# Patient Record
Sex: Female | Born: 1990 | Race: White | Hispanic: No | Marital: Single | Smoking: Current every day smoker
Health system: Southern US, Community
[De-identification: ages and names within clinical notes are randomized; demographics above are authoritative.]

## PROBLEM LIST (undated history)

## (undated) ENCOUNTER — Inpatient Hospital Stay (HOSPITAL_COMMUNITY): Payer: Self-pay

## (undated) DIAGNOSIS — R51 Headache: Secondary | ICD-10-CM

## (undated) DIAGNOSIS — N809 Endometriosis, unspecified: Secondary | ICD-10-CM

## (undated) DIAGNOSIS — D649 Anemia, unspecified: Secondary | ICD-10-CM

## (undated) DIAGNOSIS — O2340 Unspecified infection of urinary tract in pregnancy, unspecified trimester: Secondary | ICD-10-CM

## (undated) DIAGNOSIS — R519 Headache, unspecified: Secondary | ICD-10-CM

## (undated) DIAGNOSIS — F419 Anxiety disorder, unspecified: Secondary | ICD-10-CM

## (undated) DIAGNOSIS — A749 Chlamydial infection, unspecified: Secondary | ICD-10-CM

## (undated) DIAGNOSIS — E049 Nontoxic goiter, unspecified: Secondary | ICD-10-CM

## (undated) HISTORY — PX: OTHER SURGICAL HISTORY: SHX169

## (undated) HISTORY — DX: Endometriosis, unspecified: N80.9

## (undated) HISTORY — PX: TYMPANOSTOMY TUBE PLACEMENT: SHX32

---
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
## 2010-04-13 ENCOUNTER — Emergency Department (HOSPITAL_COMMUNITY): Admission: EM | Admit: 2010-04-13 | Discharge: 2010-04-13 | Payer: Self-pay | Admitting: Emergency Medicine

## 2010-07-26 ENCOUNTER — Encounter: Payer: Self-pay | Admitting: Family Medicine

## 2010-08-27 ENCOUNTER — Encounter: Payer: Self-pay | Admitting: Ophthalmology

## 2010-09-06 NOTE — Progress Notes (Signed)
Summary: Excuse note for school  Phone Note Call from Patient Call back at Home Phone [phone]   Caller: Mom Reason for Call: Talk to Nurse Summary of Call: pts mom sts pt is still not feeling well & didn't go to school today, she would like a doctor's note faxed to [employer], fax # [phone] Initial call taken by: ERIN LEVAN,  Jan 02, 2007 1:40 PM  Follow-up for Phone Call        Is this ok? Follow-up by: Jone Baseman CMA,  Jan 02, 2007 3:08 PM  Additional Follow-up for Phone Call Additional follow up Details #1::        pts mom is checking status, pt was seen by [facility] Additional Follow-up by: ERIN LEVAN,  January 05, 2007 8:54 AM   Additional Follow-up for Phone Call Additional follow up Details #2::    checking status Follow-up by: Haydee Salter,  January 07, 2007 10:32 AM  Will forward into to pt's PCP - Dr. Iven Finn per Dr. Janeece Fitting.   Spoke with Dr. Iven Finn and she sts its ok to write a note for friday but only friday.  Mom agreeable.  Will fax note to above number. ...................................................................Nayomi Tabron CMA  January 07, 2007 11:54 AM

## 2010-09-06 NOTE — Progress Notes (Signed)
Summary: triage  Phone Note Call from Patient Call back at [phone]   Caller: mom-Ann Summary of Call: Pt has sore throat and cough alot wondering if she can be seen. Initial call taken by: Clydell Hakim,  September 19, 2009 10:20 AM  Follow-up for Phone Call        left message Follow-up by: Golden Circle RN,  September 19, 2009 10:26 AM  Additional Follow-up for Phone Call Additional follow up Details #1::        left message to call back & make appt for tomorrow Additional Follow-up by: Golden Circle RN,  September 19, 2009 4:18 PM

## 2010-09-06 NOTE — Miscellaneous (Signed)
  Clinical Lists Changes  Problems: Removed problem of EAR PAIN (ICD-388.70) Removed problem of DIARRHEA, INFECTIOUS (ICD-009.2) Removed problem of UPPER RESPIRATORY INFECTION (ICD-465.9) Removed problem of Rule out  OM, ACUTE MUCOID (ICD-381.02) Removed problem of INFECTION, UP RESPIRAT, MLT SITES, ACUTE NOS (ICD-465.9) Removed problem of SYMPTOM, PAIN, ABDOMINAL, GENERALIZED (ICD-789.07) Removed problem of SYNCOPE (ICD-780.2) Removed problem of OTITIS EXTERNA, UNSPECIFIED (ICD-380.10)

## 2010-09-06 NOTE — Progress Notes (Signed)
Summary: triage  Phone Note Call from Patient Call back at [phone]   Caller: mom-Ann Moss Summary of Call: Pt has real bad cold and mom thinks she needs to be seen today. Initial call taken by: Clydell Hakim,  July 18, 2009 10:22 AM  Follow-up for Phone Call        left message Follow-up by: Golden Circle RN,  July 18, 2009 10:33 AM  Additional Follow-up for Phone Call Additional follow up Details #1::        mom states dtr lives with boyfriend. will need time to find her & get her here. appt at 1:30 as work in. mom aware of wait Additional Follow-up by: Golden Circle RN,  July 18, 2009 10:43 AM

## 2010-09-24 ENCOUNTER — Encounter: Payer: Self-pay | Admitting: *Deleted

## 2010-11-11 LAB — RAPID URINE DRUG SCREEN, HOSP PERFORMED
Barbiturates: NOT DETECTED
Cocaine: NOT DETECTED
Opiates: NOT DETECTED

## 2010-11-11 LAB — URINALYSIS, ROUTINE W REFLEX MICROSCOPIC
Bilirubin Urine: NEGATIVE
Nitrite: NEGATIVE
Specific Gravity, Urine: 1.02 (ref 1.005–1.030)
Urobilinogen, UA: 0.2 mg/dL (ref 0.0–1.0)
pH: 8.5 — ABNORMAL HIGH (ref 5.0–8.0)

## 2010-11-11 LAB — CBC
HCT: 41.5 % (ref 36.0–49.0)
MCHC: 33.4 g/dL (ref 31.0–37.0)
MCV: 87.5 fL (ref 78.0–98.0)
Platelets: 285 10*3/uL (ref 150–400)
RDW: 13.9 % (ref 11.4–15.5)

## 2010-11-11 LAB — DIFFERENTIAL
Basophils Absolute: 0.1 10*3/uL (ref 0.0–0.1)
Eosinophils Relative: 2 % (ref 0–5)
Lymphocytes Relative: 25 % (ref 24–48)
Lymphs Abs: 2.9 10*3/uL (ref 1.1–4.8)
Monocytes Absolute: 0.6 10*3/uL (ref 0.2–1.2)
Neutro Abs: 8 10*3/uL (ref 1.7–8.0)

## 2010-11-11 LAB — URINE CULTURE: Culture: NO GROWTH

## 2010-11-11 LAB — SALICYLATE LEVEL: Salicylate Lvl: <4 mg/dL (ref 2.8–20.0)

## 2010-11-11 LAB — COMPREHENSIVE METABOLIC PANEL
AST: 22 U/L (ref 0–37)
Albumin: 4.1 g/dL (ref 3.5–5.2)
BUN: 13 mg/dL (ref 6–23)
Calcium: 9.4 mg/dL (ref 8.4–10.5)
Creatinine, Ser: 1 mg/dL (ref 0.4–1.2)
Total Bilirubin: 0.5 mg/dL (ref 0.3–1.2)

## 2010-11-11 LAB — URINE MICROSCOPIC-ADD ON

## 2010-11-11 LAB — PREGNANCY, URINE: Preg Test, Ur: NEGATIVE

## 2010-11-15 LAB — CBC
HCT: 32.7 % — ABNORMAL LOW (ref 36.0–49.0)
Hemoglobin: 11.3 g/dL — ABNORMAL LOW (ref 12.0–16.0)
MCHC: 33.8 g/dL (ref 31.0–37.0)
MCHC: 34.5 g/dL (ref 31.0–37.0)
MCV: 92.6 fL (ref 78.0–98.0)
MCV: 93.9 fL (ref 78.0–98.0)
Platelets: 179 10*3/uL (ref 150–400)
RBC: 3.06 MIL/uL — ABNORMAL LOW (ref 3.80–5.70)
RBC: 3.53 MIL/uL — ABNORMAL LOW (ref 3.80–5.70)
RDW: 12.7 % (ref 11.4–15.5)
RDW: 12.8 % (ref 11.4–15.5)

## 2010-12-18 NOTE — Op Note (Signed)
[name], [name]              ACCOUNT NO.:  [id number]   MEDICAL RECORD NO.:  [id number]          PATIENT TYPE:  OIB   LOCATION:  6118                         FACILITY:  [facility]   PHYSICIAN:  Antony Contras, MD     DATE OF BIRTH:  [birth year]   DATE OF PROCEDURE:  03/13/2007  DATE OF DISCHARGE:                               OPERATIVE REPORT   PREOPERATIVE DIAGNOSES:  1. Chronic tonsillitis.  2. Tonsillar hypertrophy.   POSTOPERATIVE DIAGNOSES:  1. Chronic tonsillitis.  2. Tonsillar hypertrophy.   PROCEDURE:  Tonsillectomy.   SURGEON:  Dr. Christia Reading.   ANESTHESIA:  General endotracheal anesthesia.   COMPLICATIONS:  None.   INDICATIONS:  The patient is a 20 year old white female who has had six  cases of strep throat in the past year.  She is found to have enlarged  tonsils as well.  She presents to the operating room for surgical  management.   FINDINGS:  Tonsils are 3+ in size bilaterally with the right being a  little larger.   DESCRIPTION OF PROCEDURE:  The patient is identified in the holding room  and informed consent having been obtained from the family including the  discussion of risks, benefits, and alternatives, the patient was brought  to the operating suite and placed on the operating table in supine  position.  Anesthesia was induced and the patient was intubated by the  anesthesia team without difficulty.  The patient was given intravenous  antibiotics and steroids during the case and p.o. atenolol prior to the  case per her family doctor was recommendation.  The eyes were taped  closed and the bed was turned 90 degrees from anesthesia.  A head wrap  was placed around the patient's head.  The oropharynx was exposed using  a Crowe-Davis retractor which was placed in suspension on a Mayo stand.  The right tonsil was grasped with curved Allis and retracted medially  while a curvilinear incision was made along the anterior tonsillar  pillar using Bovie  electrocautery on a setting of 20.  This was  continued into the subcapsular plane until tonsil was removed.  The same  procedure was then carried out on the left side.  Tonsils were passed  together for pathology.  Bleeding was then controlled suction cautery on  a setting of 30.  After this, the nose and throat were copiously  irrigated with saline and a flexible suction was passed down the  esophagus to suck out the stomach and  esophagus.  A little bit of additional cautery was performed on the  right side at this point.  The throat was suctioned and the Crowe-Davis  retractor was then taken out of suspension and removed from the  patient's mouth.  She was returned back to anesthesia for wake-up and  was extubated and removed to the recovery room in stable condition.      Antony Contras, MD  Electronically Signed     DDB/MEDQ  D:  03/13/2007  T:  03/13/2007  Job:  [id number]

## 2010-12-18 NOTE — Op Note (Signed)
NAMEALTOVISE, WAHLER              ACCOUNT NO.:  [id number]   MEDICAL RECORD NO.:  [id number]          PATIENT TYPE:  INP   LOCATION:  9303                          [facility]ILITY[facility]  [facility]   PHYSICIAN:  Gerrit Friends. Aldona Bar, M.D.   DATE OF BIRTH:  Jul 20, 1991   DATE OF PROCEDURE:  10/16/2008  DATE OF DISCHARGE:                               OPERATIVE REPORT   PREOPERATIVE DIAGNOSES:  A 34 to 36-week intrauterine pregnancy, preterm  premature rupture of membranes, early labor, nonreassuring fetal heart  tracing.   POSTOPERATIVE DIAGNOSES:  A 34 to 36-week intrauterine pregnancy,  preterm premature rupture of membranes, early labor, nonreassuring fetal  heart tracing, delivery of viable female infant with Apgars __________   DICTATION ENDS HERE.      Gerrit Friends. Aldona Bar, M.D.     RMW/MEDQ  D:  10/16/2008  T:  10/17/2008  Job:  [id number]

## 2010-12-18 NOTE — Op Note (Signed)
[name], WEIGHT              ACCOUNT NO.:  [id number]   MEDICAL RECORD NO.:  [id number]          PATIENT TYPE:  MAT   LOCATION:  MATC                          [facility]ACILITY:  [facility]   PHYSICIAN:  Gerrit Friends. Aldona Bar, M.D.   DATE OF BIRTH:  [birth year]   DATE OF PROCEDURE:  10/16/2008  DATE OF DISCHARGE:                               OPERATIVE REPORT   PREOPERATIVE DIAGNOSES:  A 34 to 36-week intrauterine pregnancy, preterm  premature rupture of membranes, early labor, nonreassuring fetal heart  tracing.   POSTOPERATIVE DIAGNOSES:  A 34 to 36-week intrauterine pregnancy,  preterm premature rupture of membranes, early labor, nonreassuring fetal  heart tracing plus delivery of 2150 g female infant, Apgars 8 and 9.   PROCEDURE:  Primary low transverse cesarean section.   SURGEON:  Gerrit Friends. Aldona Bar, MD   ANESTHESIA:  Spinal.   HISTORY:  This 20 year old gravida 2, para 0 at approximately 73-36  weeks' gestation apparently had preterm premature rupture of membranes  at about 8:30 in the morning on October 16, 2008.  I was called by the  patient's mother (? aunt) at approximately 10:30 a.m. and given that  history.  I advised the patient to be taken to [facility] for  evaluation.  The patient arrived at about noon and while being evaluated  by the nurse at which time, ruptured membranes and probable early labor  were confirmed.  There was a marked fetal bradycardia and I was called  at 12:36 p.m. to be advised of the situation with the fetal bradycardia.  Upon hearing the history of at least 2 minutes of fetal bradycardia in  the 80s which was apparently at that time nonresponsive to position  change oxygen and I advised immediate notification of the operating room  for STAT section.  I arrived at the hospital shortly thereafter and at  that time, the patient was already in the operating room.  The heart  rate had recovered significantly to a rate of approximately 130 and a  decision was  made to proceed with spinal anesthetic for cesarean  section.  Dr. Jean Rosenthal carried that out without difficulty and  thereafter, the patient was placed in supine position slightly tilted to  the left.  A Foley catheter had already been inserted.  At this time,  she was prepped and draped in usual fashion and once good anesthetic  levels were documented, procedure was begun.  A Pfannenstiel incision  was made and with minimal difficulty dissected down sharply to and  through the fascia in low transverse fashion with hemostasis created at  each layer.  Subfascial space was created inferiorly and superiorly  after the fascia was incised in the low transverse fashion.  Muscles  separated in the midline.  Peritoneum was identified and entered  appropriately with care taken to avoid the bowel superiorly and the  bladder inferiorly.  At this time, the bladder blade was placed and the  vesicouterine peritoneum was incised in low transverse fashion with the  Metzenbaum scissors.  Thereafter, sharp incisions used with Metzenbaum  scissors, it was carried out in the  low transverse fashion and extended  laterally with the fingers.  As the baby was floating, it required the  use of the vacuum extractor to facilitate delivery and a viable female  infant was delivered in vertex position and cried spontaneously once.  The infant was passed off to the awaiting team.  Arterial cord pH was  not collected.  Apgars were by my visual 8 and 9.  Cord bloods were  collected and placenta was delivered intact.  There was no evidence of  nuchal cord or body cord.  Placenta was sent to pathology appropriately  labeled.  After placenta was delivered, the uterus was exteriorized and  rendered free of any remaining products of conception.  Good uterine  contractility was afforded, was slowly given intravenous Pitocin and  manual stimulation.  At this time, the baby was apparently doing well  but incision was made because  of its size (appeared less than 5 pounds)  to take the baby to newborn intensive care unit and the baby was taken  there in good condition by the neonatologist.  Subsequent weight was  found to be 2150 g (approximately 4 pounds 10 ounces).   At this time, closure of the uterus was begun.  Uterus was closed with a  single layer of #1 Vicryl in a running locking fashion, which provided  excellent hemostasis.   At this time, the tubes and ovaries noted to be normal and good uterine  contractility, having been provided with intravenous Pitocin and manual  stimulation and uterine incision was dry.  The uterus was replaced in  the abdominal cavity and after all counts were correct and no foreign  bodies noted to be remaining in the abdominal cavity, closure of the  abdomen was carried out in layers.  The abdominal peritoneum was closed  with 0 Vicryl in a running fashion and muscles secured with same.  Assured of good subfascial hemostasis, the fascia was then  reapproximated using 0 Vicryl from angle to midline bilaterally.  Subcutaneous tissues were hemostatic and staples were then used to close  the skin.  Sterile pressure dressing was applied.  At this point, the  patient was taken to recovery in a satisfactory condition having  tolerated the procedure well.  Estimated blood loss 500 mL.  All counts  were correct x2.   In summary, this patient who presented at 24-36 weeks' gestation with  preterm premature rupture of membranes in early labor, had a  nonreassuring fetal heart tracing in the maternity admissions area on  initial evaluation and was taken to the operating room for STAT cesarean  section, which was carried out without difficulty with delivery of 2150  g female infant with good Apgars.  Although cord compression was  suspected, no nuchal cord or body cord was noted.   Baby was taken to the newborn intensive care unit because of size, but  was doing well upon its leaving the  OR and mother was stable in the  recovery room.   All counts correct x2.   Mother did receive 1 g of Ancef IV preop.      Gerrit Friends. Aldona Bar, M.D.  Electronically Signed     RMW/MEDQ  D:  10/16/2008  T:  10/17/2008  Job:  [id number]

## 2010-12-21 NOTE — Discharge Summary (Signed)
[name], [name]              ACCOUNT NO.:  [id number]   MEDICAL RECORD NO.:  [id number]          PATIENT TYPE:  INP   LOCATION:  9303                          [facility]ACILITY[facility]   PHYSICIAN:  Ilda Mori, M.D.   DATE OF BIRTH:  1990-11-19   DATE OF ADMISSION:  10/16/2008  DATE OF DISCHARGE:  10/19/2008                               DISCHARGE SUMMARY   FINAL DIAGNOSES:  Intrauterine pregnancy at 56-36 weeks' gestation,  preterm premature rupture of membranes, early labor, and nonreassuring  fetal heart tracing.   PROCEDURE:  Primary low transverse cesarean section.   SURGEON:  Gerrit Friends. Aldona Bar, MD   COMPLICATIONS:  None.   This 20 year old G2, P0-0-1-0 presents around 53-36 weeks' gestation  age.  This patient with preterm premature rupture of membranes.  The  patient arrived at the hospital around noon while being evaluated from  the nurse, a diagnosis of early labor was confirmed, there was marked  fetal bradycardia.  Dr. Aldona Bar was called around 12:36 and notification of  a stat prior cesarean section was decided.  The fetal bradycardia was  not responsive to oxygen and position change.  The heart rate recovered  significantly to about 130 beats per minute, therefore spinal was able  to be administered prior to the cesarean section.  Dr. Aldona Bar performed  the procedure with the delivery of a 2150 g female infant with Apgars of  8 and 9.  There was no evidence of nuchal cord or body cord, delivery  went without complications.  The baby was taken to the NICU in good  condition.  The patient's postoperative course was benign without any  significant fevers.  Baby was in the NICU and doing well.  By  postoperative day #3, the patient was felt ready for discharge.  She was  sent home on a regular diet, told to decrease activities, told to  continue her prenatal vitamins, was given a prescription for Percocet  #20 one to two every 4-6 hours as needed for pain, told she could use  ibuprofen up to 600 mg every 6 hours as needed for pain, and was to  follow up in our office in 4 weeks.  The patient had a history of  anxiety disorder, was on Zoloft in the past, was not discharged home  with any but will be watched carefully.   LABS ON DISCHARGE:  The patient had a hemoglobin of 9.7, white blood  cell count of 13.5, and platelets of 179,000.      Leilani Able, P.A.-C.      Ilda Mori, M.D.  Electronically Signed    MB/MEDQ  D:  11/07/2008  T:  11/08/2008  Job:  [id number]
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
## 2010-12-21 NOTE — H&P (Signed)
NAMEJACKOLYN, GERON NO.:  [id number]   MEDICAL RECORD NO.:  [id number]          PATIENT TYPE:  INP   LOCATION:  0199                          FACILITY:  [facility]   PHYSICIAN:  Jasmine Pang, M.D. DATE OF BIRTH:  [birth year]   DATE OF ADMISSION:  11/09/2004  DATE OF DISCHARGE:                         PSYCHIATRIC ADMISSION ASSESSMENT   IDENTIFICATION:  [name] is a 20 year old Caucasian female from [place],  [place], who is currently in the 7th grade.  She was admitted due to  depression and a suicide attempt.   HISTORY OF PRESENT ILLNESS:  [name] was taken by EMTs to the local hospital  in [place] after having taken an overdose on ibuprofen.  She admits to  being depressed and also had become anxious because her mother was angry  with her.  This revolved around the fact that [name] went off with her  boyfriend for quite awhile and was not answering her mother's calls to her  cell phone.  After the ibuprofen overdose she was taken to her local ED in  [place] and treated with charcoal and other measures used for overdose.  She was then sent here.  She states she is close to her mother but feels  they get along fairly well, though there are some difference of opinion.  She states mother likes her boyfriend, and she was not upset that she was  with her boyfriend.  She was angry because she found [name] with him in an  area that looks like they could have been having sex.   JUSTIFICATION FOR 24-HOUR CARE:  Dangerous to self, need to be in a  controlled environment.   PAST PSYCHIATRIC HISTORY:  Patient has had a history of treatment x 1 after  her father left home and parents divorced several years ago.  She states  this was very traumatic for her.  She states father met a stripper and was  having an affair with her.  Father now has another girlfriend, and [name]  gets along fairly well with her.  There is no other psychiatric treatment.   SUBSTANCE ABUSE HISTORY:  Patient has smoked a cigarette x 1 and never  again.  She has not used drugs or alcohol.   PAST MEDICAL HISTORY:  Patient is healthy, no acute medical problems.   ALLERGIES:  No known drug allergies.   CURRENT MEDICATIONS:  None.   FAMILY AND SOCIAL HISTORY:  Patient is in the 7th grade at [employer].  Her grades are very bad, she states because she has not been doing  her homework.  She plans to work very hard upon discharge to try to pull her  grades up.  I want to turn everything around.  She lives with her mother  and stepfather in [place] where she grew up.   MENTAL STATUS EXAM:  Patient is a cooperative and reserved Caucasian female  with intermittent eye contact.  Speech was soft and slow.  There was  psychomotor retardation.  Mood depressed, anxious.  Affect consistent with  mood.  Positive suicidal ideation at the time of admission but  contracts for  safety now.  No homicidal ideation, no psychosis.  Thoughts are logical and  goal-directed.  Thought content, no particular theme.  Cognitive was grossly  intact.   ADMISSION DIAGNOSES:   AXIS I:  Mood disorder, not otherwise specified.   AXIS II:  Deferred.   AXIS III:  Healthy.   AXIS IV:  Severe, family and school.   AXIS IV:  Global assessment of function 30 now and 60 in the past year.   PATIENT ASSETS AND STRENGTHS:  Verbal, cooperative, supportive family.   ESTIMATED LENGTH OF INPATIENT TREATMENT:  5-7 days.   INITIAL DISCHARGE PLAN:  Return home to live with her mother.  Follow-up  therapy and medication management will be arranged with local mental health  providers in [place], [place].   INITIAL PLAN OF CARE:  We will possibly start some medication for mood  instability.  We will begin other unit therapeutic activities and groups.  Family therapy.  Patient will have a physical exam.  Discharge planning will  be begin immediately to ensure a smooth  transition for [name] when she  leaves the hospital.      BHS/MEDQ  D:  11/10/2004  T:  11/10/2004  Job:  [id number]

## 2011-01-30 ENCOUNTER — Emergency Department: Payer: Self-pay | Admitting: Emergency Medicine

## 2011-03-08 ENCOUNTER — Ambulatory Visit: Payer: Self-pay | Admitting: Family Medicine

## 2011-03-11 ENCOUNTER — Other Ambulatory Visit: Payer: Self-pay | Admitting: Obstetrics and Gynecology

## 2011-04-01 ENCOUNTER — Other Ambulatory Visit: Payer: Self-pay | Admitting: Obstetrics and Gynecology

## 2011-04-01 ENCOUNTER — Ambulatory Visit (HOSPITAL_COMMUNITY): Payer: BC Managed Care – PPO | Admitting: Certified Registered Nurse Anesthetist

## 2011-04-01 ENCOUNTER — Encounter (HOSPITAL_COMMUNITY): Payer: Self-pay | Admitting: Certified Registered Nurse Anesthetist

## 2011-04-01 ENCOUNTER — Encounter (HOSPITAL_COMMUNITY): Payer: Self-pay | Admitting: *Deleted

## 2011-04-01 ENCOUNTER — Ambulatory Visit (HOSPITAL_COMMUNITY)
Admission: RE | Admit: 2011-04-01 | Discharge: 2011-04-01 | Disposition: A | Discharge status: Home / Self Care | Payer: BC Managed Care – PPO | Source: Ambulatory Visit | Attending: Obstetrics and Gynecology | Admitting: Obstetrics and Gynecology

## 2011-04-01 ENCOUNTER — Encounter (HOSPITAL_COMMUNITY)
Admission: RE | Disposition: A | Discharge status: Home / Self Care | Payer: Self-pay | Source: Ambulatory Visit | Attending: Obstetrics and Gynecology

## 2011-04-01 DIAGNOSIS — O021 Missed abortion: Secondary | ICD-10-CM

## 2011-04-01 HISTORY — DX: Anxiety disorder, unspecified: F41.9

## 2011-04-01 HISTORY — PX: DILATION AND EVACUATION: SHX1459

## 2011-04-01 LAB — CBC
MCHC: 34.7 g/dL (ref 30.0–36.0)
Platelets: 196 10*3/uL (ref 150–400)
RDW: 12.4 % (ref 11.5–15.5)
WBC: 10.8 10*3/uL — ABNORMAL HIGH (ref 4.0–10.5)

## 2011-04-01 SURGERY — DILATION AND EVACUATION, UTERUS
Anesthesia: Monitor Anesthesia Care | Site: Vagina

## 2011-04-01 MED ORDER — DEXAMETHASONE SODIUM PHOSPHATE 4 MG/ML IJ SOLN
INTRAMUSCULAR | Status: DC | PRN
  Administered 2011-04-01: 10 mg via INTRAVENOUS

## 2011-04-01 MED ORDER — KETOROLAC TROMETHAMINE 30 MG/ML IJ SOLN
INTRAMUSCULAR | Status: DC | PRN
  Administered 2011-04-01: 30 mg via INTRAVENOUS

## 2011-04-01 MED ORDER — MIDAZOLAM HCL 2 MG/2ML IJ SOLN
INTRAMUSCULAR | Status: AC
  Filled 2011-04-01: qty 2

## 2011-04-01 MED ORDER — PROPOFOL 10 MG/ML IV EMUL
INTRAVENOUS | Status: DC | PRN
  Administered 2011-04-01: 20 mg via INTRAVENOUS
  Administered 2011-04-01: 40 mg via INTRAVENOUS
  Administered 2011-04-01 (×6): 20 mg via INTRAVENOUS

## 2011-04-01 MED ORDER — DEXAMETHASONE SODIUM PHOSPHATE 10 MG/ML IJ SOLN
INTRAMUSCULAR | Status: AC
  Filled 2011-04-01: qty 1

## 2011-04-01 MED ORDER — LIDOCAINE HCL 1 % IJ SOLN
INTRAMUSCULAR | Status: DC | PRN
  Administered 2011-04-01: 10 mL via INTRADERMAL

## 2011-04-01 MED ORDER — PROPOFOL 10 MG/ML IV EMUL
INTRAVENOUS | Status: AC
  Filled 2011-04-01: qty 20

## 2011-04-01 MED ORDER — FENTANYL CITRATE 0.05 MG/ML IJ SOLN
INTRAMUSCULAR | Status: AC
  Filled 2011-04-01: qty 2

## 2011-04-01 MED ORDER — ONDANSETRON HCL 4 MG/2ML IJ SOLN
INTRAMUSCULAR | Status: DC | PRN
  Administered 2011-04-01: 4 mg via INTRAVENOUS

## 2011-04-01 MED ORDER — MIDAZOLAM HCL 5 MG/5ML IJ SOLN
INTRAMUSCULAR | Status: DC | PRN
  Administered 2011-04-01: 2 mg via INTRAVENOUS

## 2011-04-01 MED ORDER — ONDANSETRON HCL 4 MG/2ML IJ SOLN
INTRAMUSCULAR | Status: AC
  Filled 2011-04-01: qty 2

## 2011-04-01 MED ORDER — LACTATED RINGERS IV SOLN
INTRAVENOUS | Status: DC
  Administered 2011-04-01: 11:00:00 via INTRAVENOUS

## 2011-04-01 MED ORDER — FENTANYL CITRATE 0.05 MG/ML IJ SOLN
INTRAMUSCULAR | Status: DC | PRN
  Administered 2011-04-01: 100 ug via INTRAVENOUS

## 2011-04-01 MED ORDER — FENTANYL CITRATE 0.05 MG/ML IJ SOLN: 25.0000 ug | INTRAMUSCULAR | Status: DC | PRN

## 2011-04-01 MED ORDER — ONDANSETRON HCL 4 MG/2ML IJ SOLN: 4.0000 mg | Freq: Once | INTRAMUSCULAR | Status: DC | PRN

## 2011-04-01 MED ORDER — LIDOCAINE HCL (CARDIAC) 20 MG/ML IV SOLN
INTRAVENOUS | Status: DC | PRN
  Administered 2011-04-01: 40 mg via INTRAVENOUS

## 2011-04-01 MED ORDER — KETOROLAC TROMETHAMINE 30 MG/ML IJ SOLN
INTRAMUSCULAR | Status: AC
  Filled 2011-04-01: qty 1

## 2011-04-01 MED ORDER — HYDROCODONE-ACETAMINOPHEN 5-500 MG PO TABS: 1.0000 | ORAL_TABLET | Freq: Four times a day (QID) | ORAL | Status: AC | PRN

## 2011-04-01 MED ORDER — KETOROLAC TROMETHAMINE 30 MG/ML IJ SOLN: 15.0000 mg | Freq: Once | INTRAMUSCULAR | Status: DC | PRN

## 2011-04-01 MED ORDER — MEPERIDINE HCL 25 MG/ML IJ SOLN: 6.2500 mg | INTRAMUSCULAR | Status: DC | PRN

## 2011-04-01 MED ORDER — LIDOCAINE HCL (CARDIAC) 20 MG/ML IV SOLN
INTRAVENOUS | Status: AC
  Filled 2011-04-01: qty 5

## 2011-04-01 MED ORDER — IBUPROFEN 200 MG PO TABS: 200.0000 mg | ORAL_TABLET | Freq: Four times a day (QID) | ORAL | Status: DC | PRN

## 2011-04-01 SURGICAL SUPPLY — 20 items
CATH ROBINSON RED A/P 16FR (CATHETERS) ×2 IMPLANT
CLOTH BEACON ORANGE TIMEOUT ST (SAFETY) ×2 IMPLANT
DECANTER SPIKE VIAL GLASS SM (MISCELLANEOUS) ×2 IMPLANT
DILATOR CANAL MILEX (MISCELLANEOUS) IMPLANT
DRAPE UTILITY XL STRL (DRAPES) ×2 IMPLANT
GLOVE BIO SURGEON STRL SZ7 (GLOVE) ×4 IMPLANT
GOWN PREVENTION PLUS LG XLONG (DISPOSABLE) ×2 IMPLANT
KIT BERKELEY 1ST TRIMESTER 3/8 (MISCELLANEOUS) ×2 IMPLANT
NDL SPNL 22GX3.5 QUINCKE BK (NEEDLE) ×1 IMPLANT
NEEDLE SPNL 22GX3.5 QUINCKE BK (NEEDLE) ×2 IMPLANT
NS IRRIG 1000ML POUR BTL (IV SOLUTION) ×2 IMPLANT
PACK VAGINAL MINOR WOMEN LF (CUSTOM PROCEDURE TRAY) ×2 IMPLANT
PAD PREP 24X48 CUFFED NSTRL (MISCELLANEOUS) ×2 IMPLANT
SET BERKELEY SUCTION TUBING (SUCTIONS) ×2 IMPLANT
SYR CONTROL 10ML LL (SYRINGE) ×2 IMPLANT
TOWEL OR 17X24 6PK STRL BLUE (TOWEL DISPOSABLE) ×4 IMPLANT
VACURETTE 10 RIGID CVD (CANNULA) ×1 IMPLANT
VACURETTE 7MM CVD STRL WRAP (CANNULA) IMPLANT
VACURETTE 8 RIGID CVD (CANNULA) IMPLANT
VACURETTE 9 RIGID CVD (CANNULA) IMPLANT

## 2011-04-01 NOTE — H&P (Signed)
  CC: [name] HPI: 20 yo [id number] presented on 03/11/11 for second opinion for dx of [name]. LMP 11/21/10. Pt had been seen at [facility], ultrasound confirmed [name] between 7-8 weeks. Repeat ultrasound 8/6 reconfirmed [name] with CRL c/w 9+5. Pt initially desired expectant management however after 2 weeks of no status change she wishes to proceed with D&E. R/B/A reviewed with pt. PMH: anxiety/depression PSH: 2010: 35 wk Primary LTCS NRFWB, PPROM female 4#12 POBGYN: [id number]. 2008 SAB no D&C, 2010 35 wk PPROM prim LTCS. Pap LGSIL 01/2011. H/O positive Chlamydia 01/09/11, test negative 03/11/11. SH: H/O tobacco & THC abuse Meds: PNV ALL: NKDA PNL: BT O +, AB neg, RPR NR, HBsAG neg, RI, HIV NR,  PE:  Filed Vitals:   04/01/11 1041  BP: 105/55  Pulse: 104  Temp: 97.7 F (36.5 C)  Resp: 18  Gen: AOX3 NAD Abd: Soft, NT/ND WBC  Date Value Range Status  04/01/2011 10.8* 4.0-10.5 (K/uL) Final     Hemoglobin  Date Value Range Status  04/01/2011 14.0  12.0-15.0 (g/dL) Final     Platelets  Date Value Range Status  04/01/2011 196  150-400 (K/uL) Final    A/P: 29 [id number] with 9 week [name] for Dilation and Evacuation 1) Consent for procedure 2) Proceed with D&E

## 2011-04-01 NOTE — Transfer of Care (Signed)
  Anesthesia Post-op Note  Patient: [name]  Procedure(s) Performed:  DILATATION AND EVACUATION (D&E)  Patient Location: PACU  Anesthesia Type: MAC  Level of Consciousness: awake, alert  and oriented  Airway and Oxygen Therapy: Patient Spontanous Breathing and Patient connected to nasal cannula oxygen  Post-op Pain: none  Post-op Assessment: Post-op Vital signs reviewed, Patient's Cardiovascular Status Stable, Respiratory Function Stable and Patent Airway  Post-op Vital Signs: Reviewed and stable  Complications: No apparent anesthesia complications

## 2011-04-01 NOTE — Op Note (Signed)
Operative Report  Pre-Operative Diagnosis: 9 week missed abortion Post-Operative Diagnosis: Same Procedure: Suction dilation and evacuation Surgeon: Waynard Reeds, MD Assistant: None Anesthesia: MAC Operative Findings: 10 week size uterus, midposition. Specimen: POCs, to pathology EBL: 150cc UOP: 20 cc Procedure: Following the appropriate informed consent, the patient was taken to the OR when she was placed in the lithotomy position in [place] sturrips after MAC anesthesia was administered. SCDs were placed on bilateral lower extremities. The perineum was prepped and draped in the normal sterile fashion. The speculum was inserted and a single toothed tenaculum was placed on the anterior lip of the cervix. A paracervical block with 10 cc 1% lidocaine was injected and the cervix was serially dilated with Shawnie Pons dilators. A # 10 suction catheter was placed transcervically and the vacuum was engaged. Three suction passes were performed for removal of products of conception.  A sharp curettage was then performed. The patient did have a moderate amount of bleeding and a final suction pass was performed.  After this, the bleeding was minimal and the uterus was firm. This completed the procedure.  All sponge, lap, and needle counts were correct x 2. The patient was taken to the PACU in stable condition after the procedure.

## 2011-04-01 NOTE — Anesthesia Preprocedure Evaluation (Signed)
Anesthesia Evaluation  Name, MR# and DOB Patient awake  General Assessment Comment  Reviewed: Allergy & Precautions, H&P , NPO status , Patient's Chart, lab work & pertinent test results, reviewed documented beta blocker date and time   Airway Mallampati: I TM Distance: >3 FB Neck ROM: full    Dental  (+) Teeth Intact   Pulmonary    pulmonary exam normalPulmonary Exam Normal     Cardiovascular     Neuro/Psych   GI/Hepatic/Renal negative GI ROS  negative Liver ROS  negative Renal ROS        Endo/Other  Negative Endocrine ROS (+)      Abdominal Normal abdominal exam  (+)   Musculoskeletal negative musculoskeletal ROS (+)   Hematology negative hematology ROS (+)   Peds  Reproductive/Obstetrics negative OB ROS    Anesthesia Other Findings tobacco            Anesthesia Physical Anesthesia Plan  ASA: II  Anesthesia Plan: MAC   Post-op Pain Management:    Induction:   Airway Management Planned:   Additional Equipment:   Intra-op Plan:   Post-operative Plan:   Informed Consent: I have reviewed the patients History and Physical, chart, labs and discussed the procedure including the risks, benefits and alternatives for the proposed anesthesia with the patient or authorized representative who has indicated his/her understanding and acceptance.     Plan Discussed with: CRNA  Anesthesia Plan Comments:         Anesthesia Quick Evaluation

## 2011-04-01 NOTE — Interval H&P Note (Signed)
History and Physical Interval Note:   04/01/2011   11:45 AM   [name]  has presented today for surgery, with the diagnosis of Missed AB  The various methods of treatment have been discussed with the patient and family. After consideration of risks, benefits and other options for treatment, the patient has consented to  Procedure(s): DILATATION AND EVACUATION (D&E) as a surgical intervention .  I have reviewed the patients' chart and labs.  Questions were answered to the patient's satisfaction.     Almon Hercules  MD       No changes in PMH since last evaluation

## 2011-04-01 NOTE — Preoperative (Addendum)
Beta Blockers   Reason not to administer Beta Blockers:Not Applicable 

## 2011-04-01 NOTE — Progress Notes (Signed)
Pt with asymptomatic bradycardia in the high 40's low 50's, dr Sherron Ales made aware, no new orders received will continue to monitor and manage as needed

## 2011-04-01 NOTE — Anesthesia Postprocedure Evaluation (Signed)
Anesthesia Post Note  Patient: [name]  Procedure(s) Performed:  DILATATION AND EVACUATION (D&E)  Anesthesia type: General  Patient location: PACU  Post pain: Pain level controlled  Post assessment: Post-op Vital signs reviewed  Last Vitals:  Filed Vitals:   04/01/11 1315  BP: 93/49  Pulse: 50  Temp:   Resp: 17    Post vital signs: Reviewed  Level of consciousness: sedated  Complications: No apparent anesthesia complicationsfj

## 2011-04-06 DEATH — deceased

## 2011-04-16 ENCOUNTER — Encounter (HOSPITAL_COMMUNITY): Payer: Self-pay | Admitting: Obstetrics and Gynecology

## 2011-05-02 LAB — POCT RAPID STREP A: Streptococcus, Group A Screen (Direct): NEGATIVE

## 2011-05-16 LAB — COMPREHENSIVE METABOLIC PANEL
ALT: 19
AST: 24
Alkaline Phosphatase: 68
CO2: 22
Calcium: 8.9
Chloride: 109
Glucose, Bld: 119 — ABNORMAL HIGH
Potassium: 3.4 — ABNORMAL LOW
Sodium: 141

## 2011-05-16 LAB — I-STAT 8, (EC8 V) (CONVERTED LAB)
BUN: 12
Bicarbonate: 21.5
Glucose, Bld: 113 — ABNORMAL HIGH
Hemoglobin: 13.9
Sodium: 140
pH, Ven: 7.385 — ABNORMAL HIGH

## 2011-05-16 LAB — RAPID URINE DRUG SCREEN, HOSP PERFORMED
Barbiturates: NOT DETECTED
Benzodiazepines: NOT DETECTED

## 2011-05-16 LAB — ACETAMINOPHEN LEVEL: Acetaminophen (Tylenol), Serum: <10 — ABNORMAL LOW

## 2011-05-16 LAB — POCT I-STAT CREATININE: Operator id: 294511

## 2011-05-16 LAB — SALICYLATE LEVEL: Salicylate Lvl: <4

## 2011-05-16 LAB — ETHANOL: Alcohol, Ethyl (B): <5

## 2011-05-20 LAB — BASIC METABOLIC PANEL WITH GFR
BUN: 11
CO2: 27
Calcium: 9.3
Chloride: 108
Creatinine, Ser: 0.76
Glucose, Bld: 104 — ABNORMAL HIGH
Potassium: 3.8
Sodium: 141

## 2011-05-20 LAB — HCG, SERUM, QUALITATIVE: Preg, Serum: NEGATIVE

## 2011-05-20 LAB — CBC
HCT: 39.9
Hemoglobin: 13.7
RBC: 4.37

## 2011-06-18 ENCOUNTER — Ambulatory Visit: Payer: BC Managed Care – PPO | Admitting: Family Medicine

## 2011-07-11 ENCOUNTER — Ambulatory Visit: Payer: BC Managed Care – PPO

## 2011-07-19 ENCOUNTER — Ambulatory Visit: Payer: BC Managed Care – PPO | Admitting: Family Medicine

## 2011-07-20 ENCOUNTER — Encounter (HOSPITAL_COMMUNITY): Payer: Self-pay | Admitting: Emergency Medicine

## 2011-07-20 ENCOUNTER — Emergency Department (HOSPITAL_COMMUNITY): Payer: BC Managed Care – PPO

## 2011-07-20 ENCOUNTER — Emergency Department (HOSPITAL_COMMUNITY)
Admission: EM | Admit: 2011-07-20 | Discharge: 2011-07-20 | Disposition: A | Discharge status: Home / Self Care | Payer: BC Managed Care – PPO | Attending: Emergency Medicine | Admitting: Emergency Medicine

## 2011-07-20 DIAGNOSIS — F17200 Nicotine dependence, unspecified, uncomplicated: Secondary | ICD-10-CM | POA: Insufficient documentation

## 2011-07-20 DIAGNOSIS — Z9889 Other specified postprocedural states: Secondary | ICD-10-CM | POA: Insufficient documentation

## 2011-07-20 DIAGNOSIS — O99891 Other specified diseases and conditions complicating pregnancy: Secondary | ICD-10-CM | POA: Insufficient documentation

## 2011-07-20 DIAGNOSIS — Z7982 Long term (current) use of aspirin: Secondary | ICD-10-CM | POA: Insufficient documentation

## 2011-07-20 DIAGNOSIS — R112 Nausea with vomiting, unspecified: Secondary | ICD-10-CM | POA: Insufficient documentation

## 2011-07-20 DIAGNOSIS — F411 Generalized anxiety disorder: Secondary | ICD-10-CM | POA: Insufficient documentation

## 2011-07-20 DIAGNOSIS — J45909 Unspecified asthma, uncomplicated: Secondary | ICD-10-CM | POA: Insufficient documentation

## 2011-07-20 DIAGNOSIS — R109 Unspecified abdominal pain: Secondary | ICD-10-CM

## 2011-07-20 LAB — BASIC METABOLIC PANEL
BUN: 11 mg/dL (ref 6–23)
CO2: 22 mEq/L (ref 19–32)
Chloride: 100 mEq/L (ref 96–112)
Creatinine, Ser: 0.74 mg/dL (ref 0.50–1.10)
Glucose, Bld: 89 mg/dL (ref 70–99)
Potassium: 3.8 mEq/L (ref 3.5–5.1)

## 2011-07-20 LAB — CBC
HCT: 40.6 % (ref 36.0–46.0)
Hemoglobin: 14 g/dL (ref 12.0–15.0)
MCH: 31.5 pg (ref 26.0–34.0)
MCHC: 34.5 g/dL (ref 30.0–36.0)
MCV: 91.4 fL (ref 78.0–100.0)

## 2011-07-20 LAB — DIFFERENTIAL
Basophils Absolute: 0 10*3/uL (ref 0.0–0.1)
Basophils Relative: 0 % (ref 0–1)
Eosinophils Absolute: 0.4 10*3/uL (ref 0.0–0.7)
Eosinophils Relative: 3 % (ref 0–5)
Monocytes Absolute: 1.1 10*3/uL — ABNORMAL HIGH (ref 0.1–1.0)
Monocytes Relative: 8 % (ref 3–12)
Neutro Abs: 8.1 10*3/uL — ABNORMAL HIGH (ref 1.7–7.7)

## 2011-07-20 LAB — URINALYSIS, ROUTINE W REFLEX MICROSCOPIC
Glucose, UA: NEGATIVE mg/dL
Leukocytes, UA: NEGATIVE
Nitrite: NEGATIVE
Protein, ur: NEGATIVE mg/dL
pH: 5.5 (ref 5.0–8.0)

## 2011-07-20 LAB — POCT PREGNANCY, URINE: Preg Test, Ur: POSITIVE

## 2011-07-20 LAB — WET PREP, GENITAL
Trich, Wet Prep: NONE SEEN
Yeast Wet Prep HPF POC: NONE SEEN

## 2011-07-20 MED ORDER — OXYCODONE-ACETAMINOPHEN 5-325 MG PO TABS: 2.0000 | ORAL_TABLET | ORAL | Status: AC | PRN

## 2011-07-20 MED ORDER — PRENATAL RX 60-1 MG PO TABS: 1.0000 | ORAL_TABLET | Freq: Every day | ORAL | Status: AC

## 2011-07-20 NOTE — ED Notes (Signed)
Patient transported to ultrasound.

## 2011-07-20 NOTE — ED Notes (Signed)
Patient had a D&C (due to a miscarriage) two to three months ago; patient states that ever since the procedure, she has had pain in her left side (describes pain as "cramping" and "stabbing" where she doubles over and has to sit down until the pain goes away).  Pain occurs around 3 times on a daily basis; each episode lasts from 30 minutes to two hours.  Pain sometimes radiates down towards her uterus.  Today, patient had pain on the left side; pain went away; and then pain started again on the right side tonight.  Patient took a home pregnancy test four days ago; pregnancy test was positive.  Reports urinary frequency; denies other urinary changes.  Denies vaginal changes.  Upon ascultation, bowel sounds are present in all four quadrants.  Abdomen is soft and non-tender.  Patient currently complaining of left sided abdominal/flank pain; rates pain 2/10 on the numerical pain scale; unable to describe pain.  Upon arrival, patient changed into gown.  Patient alert and oriented x4; PERRL present.  Will continue to monitor.

## 2011-07-20 NOTE — ED Notes (Signed)
Patient back from ultrasound; currently sitting up in bed; no respiratory or acute distress noted.  Family present at bedside.  Updated patient on plan of care; informed patient that we are currently waiting on ultrasound results to come back.  Patient has no other questions or concerns at this time.  Will continue to monitor.

## 2011-07-20 NOTE — ED Notes (Signed)
Patient currently resting quietly in bed; no respiratory or acute distress noted.  Family present at bedside.  Updated patient on plan of care; informed patient that we are currently waiting for blood test results to come back.  Patient has no other questions or concerns at this time.  Will continue to monitor.

## 2011-07-20 NOTE — ED Notes (Signed)
Patient given discharge paperwork; went over discharge instructions with patient.  Instructed patient to take prescriptions as directed, to follow up with OBGYN and primary care physician, and to return to the ED for new, concerning, or worsening conditions.

## 2011-07-20 NOTE — ED Provider Notes (Signed)
Medical screening examination/treatment/procedure(s) were performed by non-physician practitioner and as supervising physician I was immediately available for consultation/collaboration.   Taijah Macrae L Keanna Tugwell, MD 07/20/11 0703 

## 2011-07-20 NOTE — ED Provider Notes (Signed)
History     CSN: [id number] Arrival date & time: 07/20/2011  2:05 AM   First MD Initiated Contact with Patient 07/20/11 0325      Chief Complaint  Patient presents with  . Abdominal Pain    (Consider location/radiation/quality/duration/timing/severity/associated sxs/prior treatment) The history is provided by the patient.    Pt presents to the ED with complaints of left abdominal pain with nausea/ vomiting which started 1 and 1/2 month ago. The patient states that she had a positive preg test at home 1 week ago. Last menstrual period was 3 months ago. Pt denies having had an ultrasound for this pregnancy. Pt says that she had had this pain before on the left side but never on the right side and never this severe. She denies vaginal odor, vaginal bleeding, cramping, back pain, CP, SOB, syncope, weakness.   Past Medical History  Diagnosis Date  . Anxiety   . Asthma     Past Surgical History  Procedure Date  . Cesarean section 2010  . Tonsillectomy 2009  . Dilation and evacuation 04/01/2011    Procedure: DILATATION AND EVACUATION (D&E);  Surgeon: Almon Hercules;  Location: [facility];  Service: Gynecology;  Laterality: N/A;  . Dilatation and curretage     No family history on file.  History  Substance Use Topics  . Smoking status: Current Everyday Smoker -- 0.5 packs/day for 3 years    Types: Cigarettes  . Smokeless tobacco: Not on file  . Alcohol Use: Yes    OB History    Grav Para Term Preterm Abortions TAB SAB Ect Mult Living   1               Review of Systems  All other systems reviewed and are negative.    Allergies  Review of patient's allergies indicates no known allergies.  Home Medications   Current Outpatient Rx  Name Route Sig Dispense Refill  . ACETAMINOPHEN 500 MG PO TABS Oral Take 1,000 mg by mouth daily as needed. Pain      . ASPIRIN EC 81 MG PO TBEC Oral Take 81 mg by mouth every 4 (four) hours as needed. headache     . IBUPROFEN 800 MG PO  TABS Oral Take 800 mg by mouth daily as needed. pain     . THERA M PLUS PO TABS Oral Take 1 tablet by mouth daily.        BP 105/58  Pulse 98  Temp(Src) 98 F (36.7 C) (Oral)  Resp 18  SpO2 97%  Physical Exam  Nursing note and vitals reviewed. Constitutional: She appears well-developed and well-nourished.  HENT:  Head: Normocephalic and atraumatic.  Eyes: Conjunctivae are normal. Pupils are equal, round, and reactive to light.  Neck: Trachea normal, normal range of motion and full passive range of motion without pain. Neck supple.  Cardiovascular: Normal rate, regular rhythm and normal pulses.   Pulmonary/Chest: Effort normal and breath sounds normal. Chest wall is not dull to percussion. She exhibits no tenderness, no crepitus, no edema, no deformity and no retraction.  Abdominal: Soft. Normal appearance and bowel sounds are normal.  Genitourinary: Vagina normal. Pelvic exam was performed with patient supine. Uterus is enlarged. Cervix exhibits no motion tenderness and no friability. Right adnexum displays no mass, no tenderness and no fullness. Left adnexum displays no mass, no tenderness and no fullness.  Musculoskeletal: Normal range of motion.  Neurological: She is alert. She has normal strength.  Skin: Skin is warm, dry  and intact.  Psychiatric: Her speech is normal. Cognition and memory are normal.    ED Course  Procedures (including critical care time)  Labs Reviewed  BASIC METABOLIC PANEL - Abnormal; Notable for the following:    Sodium 134 (*)    All other components within normal limits  CBC - Abnormal; Notable for the following:    WBC 14.0 (*)    All other components within normal limits  DIFFERENTIAL - Abnormal; Notable for the following:    Neutro Abs 8.1 (*)    Lymphs Abs 4.5 (*)    Monocytes Absolute 1.1 (*)    All other components within normal limits  HCG, QUANTITATIVE, PREGNANCY - Abnormal; Notable for the following:    hCG, Beta Chain, Quant, S 16109  (*)    All other components within normal limits  URINALYSIS, ROUTINE W REFLEX MICROSCOPIC  POCT PREGNANCY, URINE  POCT PREGNANCY, URINE  WET PREP, GENITAL   US Ob Comp Less 14 Wks  07/20/2011  *RADIOLOGY REPORT*  Clinical Data: Were with abdominal pain, pregnant.  TRANSVAGINAL OB ULTRASOUND,OBSTETRIC <14 WK ULTRASOUND  Technique:  Transvaginal ultrasound was performed for evaluation of the gestation as well as the maternal uterus and adnexal regions.,Technique:  Transabdominal ultrasound was performed?for evaluation of?the gestation, as well as the maternal uterus and a  Comparison: None  Findings: Single intrauterine gestational sac identified. Subtle echogenic membrane noted within the gestational sac.  Embryo and yolk sac identified.  Cardiac activity documented with a heart rate of 126 beats per minute.  Measurements were obtained by mean sac diameter of 9.2 mm with an estimated gestational age of [redacted] weeks 0 days and an estimated delivery date of 03/07/2012.  There is a small subchorionic hemorrhage.  The ovaries are normal in size and appearance with bilateral color Doppler flow documented.  IMPRESSION: Single intrauterine gestation identified, with estimated age of 7 weeks 0 days by mean sac diameter.  There is a subtle echogenic membrane within the gestational sac. This is favored to represent chorioamniotic separation, which is not pathologic at this stage of the pregnancy.  Recommend attention with a short-term follow-up to document resolution.  Original Report Authenticated By: Waneta Martins, M.D.   US Ob Transvaginal  07/20/2011  *RADIOLOGY REPORT*  Clinical Data: Were with abdominal pain, pregnant.  TRANSVAGINAL OB ULTRASOUND,OBSTETRIC <14 WK ULTRASOUND  Technique:  Transvaginal ultrasound was performed for evaluation of the gestation as well as the maternal uterus and adnexal regions.,Technique:  Transabdominal ultrasound was performed?for evaluation of?the gestation, as well as the  maternal uterus and a  Comparison: None  Findings: Single intrauterine gestational sac identified. Subtle echogenic membrane noted within the gestational sac.  Embryo and yolk sac identified.  Cardiac activity documented with a heart rate of 126 beats per minute.  Measurements were obtained by mean sac diameter of 9.2 mm with an estimated gestational age of [redacted] weeks 0 days and an estimated delivery date of 03/07/2012.  There is a small subchorionic hemorrhage.  The ovaries are normal in size and appearance with bilateral color Doppler flow documented.  IMPRESSION: Single intrauterine gestation identified, with estimated age of 7 weeks 0 days by mean sac diameter.  There is a subtle echogenic membrane within the gestational sac. This is favored to represent chorioamniotic separation, which is not pathologic at this stage of the pregnancy.  Recommend attention with a short-term follow-up to document resolution.  Original Report Authenticated By: Waneta Martins, M.D.     No diagnosis found.  MDM    Pt US shows a 30 week old intrauterine pregnancy.  Wet Prep negative. Will refer pt to OB and give prenatals       Dorthula Matas, PA 07/20/11 0700

## 2011-07-20 NOTE — ED Notes (Signed)
Tiffany, PA at bedside.

## 2011-07-20 NOTE — ED Notes (Signed)
Pelvic cart set up at bedside  

## 2011-07-20 NOTE — ED Notes (Signed)
Patient currently resting quietly in bed; no respiratory or acute distress noted.  Family present at bedside.  Will continue to monitor. 

## 2011-07-20 NOTE — ED Notes (Signed)
PT. REPORTS LEFT LATERAL ABDOMINAL PAIN WITH NAUSEA / VOMITTING ONSET 1 1/2 MONTH  AGO , ALSO REPORTS HOME PREGTEST IS POSITIVE  , CAN NOT RECALL LMP.

## 2011-07-20 NOTE — ED Notes (Signed)
Tiffany Greene, PA at bedside 

## 2011-07-23 LAB — GC/CHLAMYDIA PROBE AMP, GENITAL: Chlamydia, DNA Probe: NEGATIVE

## 2011-09-12 ENCOUNTER — Encounter (HOSPITAL_COMMUNITY): Payer: Self-pay | Admitting: *Deleted

## 2011-09-12 ENCOUNTER — Inpatient Hospital Stay (HOSPITAL_COMMUNITY)
Admission: AD | Admit: 2011-09-12 | Discharge: 2011-09-12 | Disposition: A | Discharge status: Home / Self Care | Payer: BC Managed Care – PPO | Source: Ambulatory Visit | Attending: Obstetrics & Gynecology | Admitting: Obstetrics & Gynecology

## 2011-09-12 DIAGNOSIS — R109 Unspecified abdominal pain: Secondary | ICD-10-CM | POA: Insufficient documentation

## 2011-09-12 DIAGNOSIS — K5900 Constipation, unspecified: Secondary | ICD-10-CM | POA: Insufficient documentation

## 2011-09-12 DIAGNOSIS — N949 Unspecified condition associated with female genital organs and menstrual cycle: Secondary | ICD-10-CM

## 2011-09-12 DIAGNOSIS — O99891 Other specified diseases and conditions complicating pregnancy: Secondary | ICD-10-CM | POA: Insufficient documentation

## 2011-09-12 HISTORY — DX: Chlamydial infection, unspecified: A74.9

## 2011-09-12 HISTORY — DX: Nontoxic goiter, unspecified: E04.9

## 2011-09-12 LAB — URINALYSIS, ROUTINE W REFLEX MICROSCOPIC
Bilirubin Urine: NEGATIVE
Leukocytes, UA: NEGATIVE
Nitrite: NEGATIVE
Specific Gravity, Urine: 1.02 (ref 1.005–1.030)
pH: 6.5 (ref 5.0–8.0)

## 2011-09-12 NOTE — Progress Notes (Signed)
Pt presents to [facility] with constipation, abdominal pain and gas. Pt had BM yesterday after 1 week without going. Pt says she has vaginal pain that makes her stop what she is doing. Pt is [redacted]w[redacted]d, receiving prenatal care at [facility] health department.

## 2011-09-12 NOTE — ED Provider Notes (Signed)
History     CSN: [id number]  Arrival date & time 09/12/11  1739   None     Chief Complaint  Patient presents with  . Abdominal Pain  . Gas  . Constipation    last BM yesterday after 1 wk   HPI [name] is a 21 y.o. female @ [redacted]w[redacted]d gestation who presents to MAU for abdominal pain and constipation. Had not had a BM for a week until yesterday. No vaginal bleeding or discharge. Sharp pains that go from lower abdomen into the vagina. Feels like a pulling sensation.  Last pelvic exam 09/04/11 in [place] [facility] and had cultures done. Did not do a pap smear at that visit due to having abnormal in July and they want to wait. The history was provided by the patient.   Past Medical History  Diagnosis Date  . Anxiety   . Asthma   . Chlamydia   . Enlarged thyroid     Past Surgical History  Procedure Date  . Cesarean section 2010  . Tonsillectomy 2009  . Dilation and evacuation 04/01/2011    Procedure: DILATATION AND EVACUATION (D&E);  Surgeon: Almon Hercules;  Location: [facility];  Service: Gynecology;  Laterality: N/A;  . Dilatation and curretage     Family History  Problem Relation Age of Onset  . Depression Mother   . Hyperlipidemia Mother   . Diabetes Mother     History  Substance Use Topics  . Smoking status: Current Everyday Smoker -- 0.5 packs/day for 3 years    Types: Cigarettes  . Smokeless tobacco: Not on file  . Alcohol Use: No    OB History    Grav Para Term Preterm Abortions TAB SAB Ect Mult Living   4 1 0 1 2 0 2 0 0 1       Review of Systems  Constitutional: Negative for fever, chills, diaphoresis and fatigue.  HENT: Negative for ear pain, congestion, sore throat, facial swelling, neck pain, neck stiffness, dental problem and sinus pressure.   Eyes: Negative for photophobia, pain and discharge.  Respiratory: Negative for cough, chest tightness and wheezing.   Gastrointestinal: Positive for abdominal pain. Negative for nausea,  vomiting, diarrhea, constipation and abdominal distention.  Genitourinary: Positive for frequency. Negative for dysuria, flank pain, vaginal bleeding, vaginal discharge and difficulty urinating.  Musculoskeletal: Negative for myalgias, back pain and gait problem.  Skin: Negative for color change and rash.  Neurological: Negative for dizziness, speech difficulty, weakness, light-headedness, numbness and headaches.  Psychiatric/Behavioral: Negative for confusion and agitation.    Allergies  Review of patient's allergies indicates no known allergies.  Home Medications  No current outpatient prescriptions on file.  BP 117/53  Pulse 71  Temp(Src) 98.2 F (36.8 C) (Oral)  Resp 18  Ht 5\' 3"  (1.6 m)  Wt 167 lb (75.751 kg)  BMI 29.58 kg/m2  SpO2 99%  Physical Exam  Nursing note and vitals reviewed. Constitutional: She is oriented to person, place, and time. She appears well-developed and well-nourished. No distress.  HENT:  Head: Normocephalic.  Eyes: EOM are normal.  Neck: Neck supple.  Cardiovascular: Normal rate.   Pulmonary/Chest: Effort normal.  Abdominal: Soft. There is no tenderness.       Positive FHT 145. Unable to reproduce the pain the patient has had.  Genitourinary:       External genitalia without lesions. Cervix long, closed, no CMT, uterus approximately 16 week size and non tender.  Musculoskeletal: Normal range  of motion.  Neurological: She is alert and oriented to person, place, and time. No cranial nerve deficit.  Skin: Skin is warm and dry.  Psychiatric: She has a normal mood and affect. Her behavior is normal. Judgment and thought content normal.   Results for orders placed during the hospital encounter of 09/12/11 (from the past 24 hour(s))  URINALYSIS, ROUTINE W REFLEX MICROSCOPIC     Status: Abnormal   Collection Time   09/12/11  6:45 PM      Component Value Range   Color, Urine YELLOW  YELLOW    APPearance HAZY (*) CLEAR    Specific Gravity, Urine 1.020   1.005 - 1.030    pH 6.5  5.0 - 8.0    Glucose, UA NEGATIVE  NEGATIVE (mg/dL)   Hgb urine dipstick NEGATIVE  NEGATIVE    Bilirubin Urine NEGATIVE  NEGATIVE    Ketones, ur NEGATIVE  NEGATIVE (mg/dL)   Protein, ur NEGATIVE  NEGATIVE (mg/dL)   Urobilinogen, UA 0.2  0.0 - 1.0 (mg/dL)   Nitrite NEGATIVE  NEGATIVE    Leukocytes, UA NEGATIVE  NEGATIVE    Assessment: Round ligament pain   Constipation  Plan:  Stool softner   Tylenol   Follow up with OB in [facility] ED Course  Procedures   MDM          [facility], NP 09/12/11 1950  [place], NP 09/12/11 1954

## 2011-09-16 NOTE — ED Provider Notes (Signed)
Attestation of Attending Supervision of Advanced Practitioner: Evaluation and management procedures were performed by the PA/NP/CNM/OB Fellow under my supervision/collaboration. Chart reviewed, and agree with management and plan.  Mykela Mewborn, M.D. 09/16/2011 1:42 PM   

## 2011-09-26 ENCOUNTER — Encounter (HOSPITAL_COMMUNITY): Payer: Self-pay | Admitting: *Deleted

## 2011-09-26 ENCOUNTER — Emergency Department (HOSPITAL_COMMUNITY)
Admission: EM | Admit: 2011-09-26 | Discharge: 2011-09-27 | Disposition: A | Discharge status: Home / Self Care | Payer: BC Managed Care – PPO | Attending: Emergency Medicine | Admitting: Emergency Medicine

## 2011-09-26 DIAGNOSIS — R0981 Nasal congestion: Secondary | ICD-10-CM

## 2011-09-26 DIAGNOSIS — J45909 Unspecified asthma, uncomplicated: Secondary | ICD-10-CM | POA: Insufficient documentation

## 2011-09-26 DIAGNOSIS — H9209 Otalgia, unspecified ear: Secondary | ICD-10-CM | POA: Insufficient documentation

## 2011-09-26 DIAGNOSIS — J3489 Other specified disorders of nose and nasal sinuses: Secondary | ICD-10-CM | POA: Insufficient documentation

## 2011-09-26 DIAGNOSIS — O99891 Other specified diseases and conditions complicating pregnancy: Secondary | ICD-10-CM | POA: Insufficient documentation

## 2011-09-26 NOTE — ED Notes (Signed)
Pt c/o bilateral ear pain, exposed to child with ear infection.  Not taking any abx.  Pain has been lasting for 1 week.  Also states she has hx of asthma and is out of her inhaler at this time with no rx for one.

## 2011-09-26 NOTE — ED Notes (Signed)
Pt was dx with ear infection and sinus infection a week ago.  Pt was given abx but did not take it.  Pt feels that pain is increasing.  No loss of hearing.  Pt denies any fever

## 2011-09-27 MED ORDER — OXYMETAZOLINE HCL 0.05 % NA SOLN
1.0000 | Freq: Once | NASAL | Status: AC
  Administered 2011-09-27: 1 via NASAL
  Filled 2011-09-27: qty 15

## 2011-09-27 NOTE — Discharge Instructions (Signed)
You were seen and evaluated stay in the emergency room for your symptoms of your pain and nasal congestions. At this time your providers feel that you do not have any emergency. Please followup with a primary care provider or you OB/GYN specialist for continued evaluation and treatment of your symptoms.   Saline Nose Drops  To help clear a stuffy nose, put salt water (saline) nose drops in your infant's nose. This helps to loosen the secretions in the nose. Use a bulb syringe to clean the nose out:  Before feeding.   Before putting your infant down for naps.   No more than once every 3 hours to avoid irritating your infant's nostrils.  HOME CARE  Buy nose drops at your local drug store. You can also make nose drops yourself. Mix 1 cup of water with  teaspoon of salt. Stir. Store this mixture at room temperature. Make a new batch daily.   To use the drops:   Put 1 or 2 drops in each side of infant's nose with a clean medicine dropper. Do not use this dropper for any other medicine.   Squeeze the air out of the suction bulb before inserting it into your infant's nose.   While still squeezing the bulb flat, place the tip of the bulb into a nostril. Let air come back into the bulb. The suction will pull snot out of the nose and into the bulb.   Repeat on other nostril.   Squeeze the bulb several times into a tissue and wash the bulb tip in soapy water. Store the bulb with the tip side down on paper towel.   Use the bulb syringe with only the saline drops to avoid irritating your infant's nostrils.  GET HELP RIGHT AWAY IF:  The snot changes to green or yellow.   The snot gets thicker.   Your infant is 3 months or younger with a rectal temperature of 100.4 F (38 C) or higher.   Your infant is older than 3 months with a rectal temperature of 102 F (38.9 C) or higher.   The stuffy nose lasts 10 days or longer.   There is trouble breathing or feeding.  MAKE SURE  YOU:  Understand these instructions.   Will watch your infant's condition.   Will get help right away if your infant is not doing well or gets worse.  Document Released: 05/19/2009 Document Revised: 04/03/2011 Document Reviewed: 05/19/2009 [facility] Patient Information 2012 [place], [place].

## 2011-09-27 NOTE — ED Notes (Signed)
Rx x 0, pt d/c home with family.  Voiced understanding to f/u with PCP for follow up

## 2011-09-27 NOTE — ED Provider Notes (Signed)
History     CSN: [id number]  Arrival date & time 09/26/11  2244   First MD Initiated Contact with Patient 09/26/11 2359      Chief Complaint  Patient presents with  . Otalgia     HPI  History provided by the patient. Patient is 21 year old female with history of anxiety, asthma, currently [redacted] wks pregnant who presents with complaints of bilateral ear pains for the past several days. Patient reports having similar symptoms with sinus congestion for the past week. Patient states she was seen by another provider for these symptoms, given prescription for a "liquid" medicine to help with symptoms but reports this has not helped. Patient now complains primarily of bilateral ear pains. She still reports feeling some sinus pressure. patient denies any fever, chills, sweats, cough, nausea or vomiting. She denies any abdominal pains, vaginal bleeding or vaginal discharge. Symptoms are described as moderate. Patient denies any aggravating or alleviating factors.    Past Medical History  Diagnosis Date  . Anxiety   . Asthma   . Chlamydia   . Enlarged thyroid     Past Surgical History  Procedure Date  . Cesarean section 2010  . Tonsillectomy 2009  . Dilation and evacuation 04/01/2011    Procedure: DILATATION AND EVACUATION (D&E);  Surgeon: Almon Hercules;  Location: [facility];  Service: Gynecology;  Laterality: N/A;  . Dilatation and curretage     Family History  Problem Relation Age of Onset  . Depression Mother   . Hyperlipidemia Mother   . Diabetes Mother     History  Substance Use Topics  . Smoking status: Current Everyday Smoker -- 0.5 packs/day for 3 years    Types: Cigarettes  . Smokeless tobacco: Not on file  . Alcohol Use: No    OB History    Grav Para Term Preterm Abortions TAB SAB Ect Mult Living   4 1 0 1 2 0 2 0 0 1       Review of Systems  Constitutional: Negative for fever and chills.  HENT: Positive for ear pain and sinus pressure.   Respiratory: Negative for  cough and shortness of breath.   Gastrointestinal: Negative for nausea, vomiting and abdominal pain.  Genitourinary: Negative for vaginal bleeding and vaginal discharge.  All other systems reviewed and are negative.    Allergies  Review of patient's allergies indicates no known allergies.  Home Medications   Current Outpatient Rx  Name Route Sig Dispense Refill  . ACETAMINOPHEN 500 MG PO TABS Oral Take 1,000 mg by mouth daily as needed. For pain     . PRENATAL RX 60-1 MG PO TABS Oral Take 1 tablet by mouth daily. 30 tablet 0    BP 117/56  Pulse 64  Temp(Src) 97.6 F (36.4 C) (Oral)  Resp 18  SpO2 99%  Physical Exam  Nursing note and vitals reviewed. Constitutional: She is oriented to person, place, and time. She appears well-developed and well-nourished. No distress.  HENT:  Head: Normocephalic.  Right Ear: Tympanic membrane and external ear normal.  Left Ear: Tympanic membrane and external ear normal.       Popping with slight tenderness over bilateral TMs. Mild tenderness to palpation over bilateral maxillary sinus area. Good air movement through both nostrils.  Cardiovascular: Normal rate and regular rhythm.   Pulmonary/Chest: Effort normal and breath sounds normal. No respiratory distress. She has no wheezes. She has no rales.  Abdominal: Soft. There is no tenderness.  Neurological: She is alert and  oriented to person, place, and time.  Skin: Skin is warm and dry. No rash noted.  Psychiatric: She has a normal mood and affect. Her behavior is normal.    ED Course  Procedures     1. Nasal congestion       MDM  12:00 AM patient seen and evaluated. Patient no acute distress.        Angus Seller, [place] 09/27/11 [phone]

## 2011-10-01 NOTE — ED Provider Notes (Signed)
Evaluation and management procedures were performed by the PA/NP under my supervision/collaboration.   [name] D Laloni Rowton, MD 10/01/11 2021 

## 2011-11-07 ENCOUNTER — Encounter (HOSPITAL_COMMUNITY): Payer: Self-pay

## 2011-11-07 ENCOUNTER — Inpatient Hospital Stay (HOSPITAL_COMMUNITY)
Admission: AD | Admit: 2011-11-07 | Discharge: 2011-11-07 | Disposition: A | Discharge status: Home / Self Care | Payer: BC Managed Care – PPO | Source: Ambulatory Visit | Attending: Obstetrics & Gynecology | Admitting: Obstetrics & Gynecology

## 2011-11-07 DIAGNOSIS — O99891 Other specified diseases and conditions complicating pregnancy: Secondary | ICD-10-CM | POA: Insufficient documentation

## 2011-11-07 DIAGNOSIS — O212 Late vomiting of pregnancy: Secondary | ICD-10-CM | POA: Insufficient documentation

## 2011-11-07 DIAGNOSIS — K529 Noninfective gastroenteritis and colitis, unspecified: Secondary | ICD-10-CM

## 2011-11-07 DIAGNOSIS — R109 Unspecified abdominal pain: Secondary | ICD-10-CM | POA: Insufficient documentation

## 2011-11-07 DIAGNOSIS — A088 Other specified intestinal infections: Secondary | ICD-10-CM | POA: Insufficient documentation

## 2011-11-07 DIAGNOSIS — R197 Diarrhea, unspecified: Secondary | ICD-10-CM | POA: Insufficient documentation

## 2011-11-07 LAB — URINALYSIS, ROUTINE W REFLEX MICROSCOPIC
Hgb urine dipstick: NEGATIVE
Leukocytes, UA: NEGATIVE
Nitrite: NEGATIVE
Specific Gravity, Urine: >1.03 — ABNORMAL HIGH (ref 1.005–1.030)
Urobilinogen, UA: 0.2 mg/dL (ref 0.0–1.0)

## 2011-11-07 MED ORDER — ONDANSETRON HCL 4 MG PO TABS
8.0000 mg | ORAL_TABLET | Freq: Once | ORAL | Status: AC
  Administered 2011-11-07: 8 mg via ORAL
  Filled 2011-11-07: qty 2

## 2011-11-07 MED ORDER — ONDANSETRON HCL 8 MG PO TABS: 8.0000 mg | ORAL_TABLET | Freq: Three times a day (TID) | ORAL | Status: AC | PRN

## 2011-11-07 NOTE — MAU Note (Signed)
Patient states she has been getting her prenatal care at [facility]. Has had nausea, vomiting and diarrhea since last night. Unable to keep anything down. Having abdominal cramping.

## 2011-11-07 NOTE — MAU Provider Note (Signed)
History     CSN: [id number]  Arrival date and time: 11/07/11 1318   First Provider Initiated Contact with Patient 11/07/11 1415      Chief Complaint  Patient presents with  . Emesis  . Diarrhea  . Abdominal Pain   HPI  Pt is a [id number] @ [redacted]w[redacted]d w/ acute onset of nausea, emesis, and diarrhea starting at 07:00 today. Pt awoke at 4am w/ nausea but went back to sleep. Denies fever, rash, abdominal pain, contractions. Minimal mucusy discharge for the past 7 days. Denies any gush of fluid or bloody discharge. 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 Pt reports eating some of the food, but denies any sick contacts at work or home.   Previous delivery at 35wks by CS due to NRFHT.   PNC at [facility] - benign PNC   OB History    Grav Para Term Preterm Abortions TAB SAB Ect Mult Living   4 1 0 1 2 0 2 0 0 1       Past Medical History  Diagnosis Date  . Anxiety   . Asthma   . Chlamydia   . Enlarged thyroid     Past Surgical History  Procedure Date  . Cesarean section 2010  . Tonsillectomy 2009  . Dilation and evacuation 04/01/2011    Procedure: DILATATION AND EVACUATION (D&E);  Surgeon: Almon Hercules;  Location: [facility];  Service: Gynecology;  Laterality: N/A;  . Dilatation and curretage     Family History  Problem Relation Age of Onset  . Depression Mother   . Hyperlipidemia Mother   . Diabetes Mother   . Anesthesia problems Neg Hx     History  Substance Use Topics  . Smoking status: Current Everyday Smoker -- 0.2 packs/day for 3 years    Types: Cigarettes  . Smokeless tobacco: Not on file  . Alcohol Use: No    Allergies: No Known Allergies  Prescriptions prior to admission  Medication Sig Dispense Refill  . acetaminophen (TYLENOL) 500 MG tablet Take 1,000 mg by mouth daily as needed. For pain       . Prenatal Vit-Fe Fumarate-FA (PRENATAL MULTIVITAMIN) 60-1 MG tablet Take 1 tablet by mouth daily.  30 tablet  0   ROS: See HPI w/  the following additions Review of Systems  Constitutional: Negative for fever and chills.  Eyes: Negative for blurred vision, double vision and photophobia.  Cardiovascular: Negative for chest pain and palpitations.  Gastrointestinal: Positive for heartburn, nausea, vomiting and diarrhea. Negative for abdominal pain, constipation and blood in stool.  Genitourinary: Negative for dysuria, hematuria and flank pain.  Musculoskeletal: Positive for back pain.  Skin: Negative for rash.  Neurological: Negative for focal weakness and headaches.    Physical Exam   Blood pressure 128/74, pulse 64, temperature 98.7 F (37.1 C), temperature source Oral, resp. rate 16, height 5\' 4"  (1.626 m), weight 76.749 kg (169 lb 3.2 oz), SpO2 100.00%.  Physical Exam  Constitutional: She is oriented to person, place, and time. She appears well-developed and well-nourished. No distress.  HENT:  Head: Normocephalic and atraumatic.  Mouth/Throat: Oropharynx is clear and moist.  Eyes: EOM are normal. Left eye exhibits no discharge. No scleral icterus.  Cardiovascular: Normal rate, regular rhythm and intact distal pulses.   Respiratory: Effort normal and breath sounds normal.  Musculoskeletal: Normal range of motion. She exhibits no edema.  Neurological: She is alert and oriented to person, place, and  time.  Skin: Skin is warm and dry. She is not diaphoretic.  Psychiatric: She has a normal mood and affect. Her behavior is normal. Judgment and thought content normal.    MAU Course  Procedures    Assessment and Plan  21 yo [redacted]w[redacted]d G59P1 F w/ acute onset nausea, emesis, and diarrhea. Likely viral gastroenteritis vs mild bacterial gastro from eating spoiled food. Mildly dehydrated. Fetus looks well on monitors.  - If VS deteriorate, or if unable to keep down PO will consider IVF for rehydration - Will advance diet as tolerated.  - Zofran for nausea - Reviewed reasons for return w/ pt   MERRELL, DAVID 11/07/2011,  3:05 PM   Seen by me also and I agree with above note.

## 2011-11-07 NOTE — Discharge Instructions (Signed)
Your symptoms are likely due to a viral or mild bacterial sotmach/intestinal infection. Please continue to eat and drink gingerly. Please come back if you are unable to keep any fluids or solid food down and are becoming dehydrated. Please refer to the handout below.

## 2011-11-07 NOTE — MAU Provider Note (Signed)
Patient also seen by me. Agree with note and plan.

## 2012-01-18 ENCOUNTER — Encounter (HOSPITAL_COMMUNITY): Payer: Self-pay | Admitting: Emergency Medicine

## 2012-01-18 ENCOUNTER — Emergency Department (HOSPITAL_COMMUNITY)
Admission: EM | Admit: 2012-01-18 | Discharge: 2012-01-19 | Disposition: A | Discharge status: Home / Self Care | Payer: BC Managed Care – PPO | Attending: Emergency Medicine | Admitting: Emergency Medicine

## 2012-01-18 DIAGNOSIS — J329 Chronic sinusitis, unspecified: Secondary | ICD-10-CM | POA: Insufficient documentation

## 2012-01-18 DIAGNOSIS — J069 Acute upper respiratory infection, unspecified: Secondary | ICD-10-CM

## 2012-01-18 DIAGNOSIS — O99330 Smoking (tobacco) complicating pregnancy, unspecified trimester: Secondary | ICD-10-CM | POA: Insufficient documentation

## 2012-01-18 DIAGNOSIS — O99891 Other specified diseases and conditions complicating pregnancy: Secondary | ICD-10-CM | POA: Insufficient documentation

## 2012-01-18 MED ORDER — DIPHENHYDRAMINE HCL 25 MG PO TABS: 25.0000 mg | ORAL_TABLET | Freq: Four times a day (QID) | ORAL | Status: DC | PRN

## 2012-01-18 NOTE — ED Provider Notes (Signed)
History     CSN: [id number]  Arrival date & time 01/18/12  2313   None     Chief Complaint  Patient presents with  . Sinusitis    (Consider location/radiation/quality/duration/timing/severity/associated sxs/prior treatment) HPI Comments: Patient with URI symptoms, consisting of sore throat, postnasal drip, ear pressure, for the past week.  Taking over-the-counter Tylenol, do, to her pregnancy, here for some relief of her symptoms  Patient is a 21 y.o. female presenting with sinusitis. The history is provided by the patient.  Sinusitis  This is a new problem. The current episode started more than 1 week ago. The problem has not changed since onset.There has been no fever. The pain is at a severity of 3/10. The pain is mild. Associated symptoms include ear pain and sore throat. Pertinent negatives include no chills and no shortness of breath.    Past Medical History  Diagnosis Date  . Anxiety   . Asthma   . Chlamydia   . Enlarged thyroid     Past Surgical History  Procedure Date  . Cesarean section 2010  . Tonsillectomy 2009  . Dilation and evacuation 04/01/2011    Procedure: DILATATION AND EVACUATION (D&E);  Surgeon: Almon Hercules;  Location: [facility];  Service: Gynecology;  Laterality: N/A;  . Dilatation and curretage     Family History  Problem Relation Age of Onset  . Depression Mother   . Hyperlipidemia Mother   . Diabetes Mother   . Anesthesia problems Neg Hx     History  Substance Use Topics  . Smoking status: Current Everyday Smoker -- 0.2 packs/day for 3 years    Types: Cigarettes  . Smokeless tobacco: Not on file  . Alcohol Use: No    OB History    Grav Para Term Preterm Abortions TAB SAB Ect Mult Living   4 1 0 1 2 0 2 0 0 1       Review of Systems  Constitutional: Negative for fever and chills.  HENT: Positive for ear pain, sore throat, rhinorrhea and postnasal drip. Negative for trouble swallowing and ear discharge.   Respiratory: Negative for  shortness of breath.   Cardiovascular: Negative for leg swelling.  Neurological: Negative for dizziness and headaches.    Allergies  Review of patient's allergies indicates no known allergies.  Home Medications   Current Outpatient Rx  Name Route Sig Dispense Refill  . ACETAMINOPHEN 500 MG PO TABS Oral Take 1,000 mg by mouth daily as needed. For pain     . PRENATAL RX 60-1 MG PO TABS Oral Take 1 tablet by mouth daily. 30 tablet 0    BP 115/59  Pulse 108  Temp 97.6 F (36.4 C) (Oral)  Resp 16  SpO2 97%  Physical Exam  Constitutional: She appears well-developed and well-nourished.  HENT:  Head: Normocephalic.  Neck: Normal range of motion.  Cardiovascular: Normal rate.   Pulmonary/Chest: Effort normal.  Abdominal:       Uterus gravid  Musculoskeletal: Normal range of motion.  Neurological: She is alert.  Skin: Skin is warm. No rash noted. No pallor.    ED Course  Procedures (including critical care time)  Labs Reviewed - No data to display No results found.   No diagnosis found.    MDM   Patient didn't know what else she could take that is safe in her pregnancy.  For her congestion.  She did not call her OB/GYN        Arman Filter, NP  01/18/12 2358  Arman Filter, NP 01/19/12 0001

## 2012-01-18 NOTE — ED Notes (Signed)
Patient complaining of chills, sore throat, cold symptoms, headache, and sinus pressure for the past four days.  Denies shortness of breath and chest pain.  EDD August 3rd.

## 2012-01-19 NOTE — Discharge Instructions (Signed)
Saline Nose Drops  To help clear a stuffy nose, put salt water (saline) nose drops in your infant's nose. This helps to loosen the secretions in the nose. Use a bulb syringe to clean the nose out:  Before feeding.   Before putting your infant down for naps.   No more than once every 3 hours to avoid irritating your infant's nostrils.  HOME CARE  Buy nose drops at your local drug store. You can also make nose drops yourself. Mix 1 cup of water with  teaspoon of salt. Stir. Store this mixture at room temperature. Make a new batch daily.   To use the drops:   Put 1 or 2 drops in each side of infant's nose with a clean medicine dropper. Do not use this dropper for any other medicine.   Squeeze the air out of the suction bulb before inserting it into your infant's nose.   While still squeezing the bulb flat, place the tip of the bulb into a nostril. Let air come back into the bulb. The suction will pull snot out of the nose and into the bulb.   Repeat on other nostril.   Squeeze the bulb several times into a tissue and wash the bulb tip in soapy water. Store the bulb with the tip side down on paper towel.   Use the bulb syringe with only the saline drops to avoid irritating your infant's nostrils.  GET HELP RIGHT AWAY IF:  The snot changes to green or yellow.   The snot gets thicker.   Your infant is 3 months or younger with a rectal temperature of 100.4 F (38 C) or higher.   Your infant is older than 3 months with a rectal temperature of 102 F (38.9 C) or higher.   The stuffy nose lasts 10 days or longer.   There is trouble breathing or feeding.  MAKE SURE YOU:  Understand these instructions.   Will watch your infant's condition.   Will get help right away if your infant is not doing well or gets worse.  Document Released: 05/19/2009 Document Revised: 07/11/2011 Document Reviewed: 05/19/2009 [employer] Patient Information 2012 [employer]. 

## 2012-01-20 NOTE — ED Provider Notes (Signed)
Medical screening examination/treatment/procedure(s) were performed by non-physician practitioner and as supervising physician I was immediately available for consultation/collaboration.    Shastina Rua D Salina Stanfield, MD 01/20/12 0241 

## 2012-02-04 ENCOUNTER — Observation Stay: Payer: Self-pay | Admitting: Obstetrics and Gynecology

## 2012-02-04 LAB — URINALYSIS, COMPLETE
Bilirubin,UR: NEGATIVE
Blood: NEGATIVE
Glucose,UR: NEGATIVE mg/dL (ref 0–75)
Ketone: NEGATIVE
Leukocyte Esterase: NEGATIVE
Ph: 7 (ref 4.5–8.0)
RBC,UR: 1 /HPF (ref 0–5)

## 2012-02-25 ENCOUNTER — Inpatient Hospital Stay: Payer: Self-pay | Admitting: Obstetrics and Gynecology

## 2012-02-25 LAB — CBC WITH DIFFERENTIAL/PLATELET
Basophil %: 0.4 %
Eosinophil #: 0.2 10*3/uL (ref 0.0–0.7)
Eosinophil %: 1.6 %
HCT: 38.2 % (ref 35.0–47.0)
HGB: 12.6 g/dL (ref 12.0–16.0)
Lymphocyte %: 22 %
MCH: 31.7 pg (ref 26.0–34.0)
MCHC: 32.9 g/dL (ref 32.0–36.0)
Monocyte %: 8.1 %
Neutrophil #: 10.7 10*3/uL — ABNORMAL HIGH (ref 1.4–6.5)
Neutrophil %: 67.9 %
RBC: 3.96 10*6/uL (ref 3.80–5.20)

## 2012-02-26 LAB — HEMATOCRIT: HCT: 35.8 % (ref 35.0–47.0)

## 2012-07-01 ENCOUNTER — Emergency Department (INDEPENDENT_AMBULATORY_CARE_PROVIDER_SITE_OTHER)
Admission: EM | Admit: 2012-07-01 | Discharge: 2012-07-01 | Disposition: A | Discharge status: Home / Self Care | Payer: BC Managed Care – PPO | Source: Home / Self Care | Attending: Family Medicine | Admitting: Family Medicine

## 2012-07-01 ENCOUNTER — Encounter (HOSPITAL_COMMUNITY): Payer: Self-pay | Admitting: Emergency Medicine

## 2012-07-01 DIAGNOSIS — J329 Chronic sinusitis, unspecified: Secondary | ICD-10-CM

## 2012-07-01 DIAGNOSIS — J40 Bronchitis, not specified as acute or chronic: Secondary | ICD-10-CM

## 2012-07-01 LAB — POCT PREGNANCY, URINE: Preg Test, Ur: NEGATIVE

## 2012-07-01 LAB — POCT RAPID STREP A: Streptococcus, Group A Screen (Direct): NEGATIVE

## 2012-07-01 MED ORDER — IBUPROFEN 600 MG PO TABS: 600.0000 mg | ORAL_TABLET | Freq: Three times a day (TID) | ORAL | Status: DC

## 2012-07-01 MED ORDER — ALBUTEROL SULFATE HFA 108 (90 BASE) MCG/ACT IN AERS: 1.0000 | INHALATION_SPRAY | Freq: Four times a day (QID) | RESPIRATORY_TRACT | Status: DC | PRN

## 2012-07-01 MED ORDER — PREDNISONE 20 MG PO TABS: ORAL_TABLET | ORAL | Status: DC

## 2012-07-01 MED ORDER — AMOXICILLIN 500 MG PO CAPS: 500.0000 mg | ORAL_CAPSULE | Freq: Three times a day (TID) | ORAL | Status: DC

## 2012-07-01 NOTE — ED Provider Notes (Signed)
History     CSN: [id number]  Arrival date & time 07/01/12  4098   First MD Initiated Contact with Patient 07/01/12 1024      Chief Complaint  Patient presents with  . URI    (Consider location/radiation/quality/duration/timing/severity/associated sxs/prior treatment) HPI Comments: 21 year old female smoker, 4 months postpartum here complaining of nasal congestion, headache, cough productive of a white yellow sputum generalized body aches for one week and half. Started to develop ear ache, eye tearing, subjective fever and loss of appetite in the last 2 days. Reports intermittent wheezing. Denies chest pain or current shortness of breath. Patient taking Tylenol last dose over 8 hours. Temperature here is 98 Fahrenheit.   Past Medical History  Diagnosis Date  . Anxiety   . Asthma   . Chlamydia   . Enlarged thyroid     Past Surgical History  Procedure Date  . Cesarean section 2010  . Tonsillectomy 2009  . Dilation and evacuation 04/01/2011    Procedure: DILATATION AND EVACUATION (D&E);  Surgeon: Almon Hercules;  Location: [facility];  Service: Gynecology;  Laterality: N/A;  . Dilatation and curretage     Family History  Problem Relation Age of Onset  . Depression Mother   . Hyperlipidemia Mother   . Diabetes Mother   . Anesthesia problems Neg Hx     History  Substance Use Topics  . Smoking status: Current Every Day Smoker -- 0.2 packs/day for 3 years    Types: Cigarettes  . Smokeless tobacco: Not on file  . Alcohol Use: Yes    OB History    Grav Para Term Preterm Abortions TAB SAB Ect Mult Living   4 1 0 1 2 0 2 0 0 1       Review of Systems  Constitutional: Positive for appetite change. Negative for fever and chills.  HENT: Positive for ear pain, congestion, sore throat, rhinorrhea and sinus pressure. Negative for trouble swallowing.   Respiratory: Positive for cough and wheezing.   Gastrointestinal: Negative for vomiting, abdominal pain and diarrhea.  Skin:  Negative for rash.  Neurological: Positive for headaches. Negative for dizziness.    Allergies  Review of patient's allergies indicates no known allergies.  Home Medications   Current Outpatient Rx  Name  Route  Sig  Dispense  Refill  . DIPHENHYDRAMINE HCL 25 MG PO TABS   Oral   Take 1 tablet (25 mg total) by mouth every 6 (six) hours as needed for itching.   20 tablet   0   . PRENATAL RX 60-1 MG PO TABS   Oral   Take 1 tablet by mouth daily.   30 tablet   0     BP 112/73  Pulse 64  Temp 98 F (36.7 C) (Oral)  Resp 16  SpO2 98%  Physical Exam  Nursing note and vitals reviewed. Constitutional: She is oriented to person, place, and time. She appears well-developed and well-nourished. No distress.  HENT:  Head: Normocephalic and atraumatic.  Right Ear: External ear normal.  Left Ear: External ear normal.       Nasal Congestion with erythema and swelling of nasal turbinates, yellow rhinorrhea. Significant pharyngeal erythema no exudates. No uvula deviation. No trismus. TM's with increased vascular markings and dullness in left side. Normal right TM.  Eyes: Conjunctivae normal and EOM are normal. Pupils are equal, round, and reactive to light. Right eye exhibits no discharge. Left eye exhibits no discharge. No scleral icterus.  Neck: Neck supple. No thyromegaly  present.  Cardiovascular: Normal rate, regular rhythm and normal heart sounds.   Pulmonary/Chest: Effort normal and breath sounds normal. No respiratory distress. She has no wheezes. She has no rales. She exhibits no tenderness.  Lymphadenopathy:    She has no cervical adenopathy.  Neurological: She is alert and oriented to person, place, and time.  Skin: No rash noted. She is not diaphoretic.    ED Course  Procedures (including critical care time)   Labs Reviewed  POCT RAPID STREP A ([facility] CARE ONLY)  POCT PREGNANCY, URINE   No results found.   1. Sinusitis   2. Bronchitis       MDM  Treated  with amoxicillin, albuterol, ibuprofen and prednisone, encouraged smoking cessation.   Sharin Grave, MD 07/03/12 [phone]

## 2012-07-01 NOTE — ED Notes (Signed)
Reports flu like symptoms a week and half ago.  Have tired OTC medications.  Sx's  Runny nose, sore throat, headache, ear ache, eye watering, loss appetite and fever

## 2013-02-11 ENCOUNTER — Emergency Department: Payer: Self-pay | Admitting: Emergency Medicine

## 2013-02-28 ENCOUNTER — Emergency Department: Payer: Self-pay | Admitting: Emergency Medicine

## 2013-02-28 LAB — CBC WITH DIFFERENTIAL/PLATELET
Basophil #: 0.1 10*3/uL (ref 0.0–0.1)
HGB: 13.4 g/dL (ref 12.0–16.0)
Lymphocyte %: 43.9 %
MCH: 31.6 pg (ref 26.0–34.0)
MCHC: 34.7 g/dL (ref 32.0–36.0)
MCV: 91 fL (ref 80–100)
Monocyte #: 0.5 x10 3/mm (ref 0.2–0.9)
Monocyte %: 6.2 %
Neutrophil #: 3.6 10*3/uL (ref 1.4–6.5)
Platelet: 217 10*3/uL (ref 150–440)
RBC: 4.23 10*6/uL (ref 3.80–5.20)

## 2013-05-17 LAB — HM PAP SMEAR: HM Pap smear: NEGATIVE

## 2013-05-19 ENCOUNTER — Ambulatory Visit: Payer: Self-pay | Admitting: Advanced Practice Midwife

## 2013-06-30 ENCOUNTER — Emergency Department: Payer: Self-pay | Admitting: Emergency Medicine

## 2013-08-01 ENCOUNTER — Emergency Department: Payer: Self-pay | Admitting: Emergency Medicine

## 2013-08-01 LAB — RAPID INFLUENZA A&B ANTIGENS

## 2013-09-24 ENCOUNTER — Observation Stay: Payer: Self-pay | Admitting: Obstetrics and Gynecology

## 2013-09-25 ENCOUNTER — Observation Stay: Payer: Self-pay | Admitting: Obstetrics & Gynecology

## 2013-09-25 LAB — URINALYSIS, COMPLETE
BLOOD: NEGATIVE
Bacteria: NONE SEEN
Bilirubin,UR: NEGATIVE
GLUCOSE, UR: NEGATIVE mg/dL (ref 0–75)
KETONE: NEGATIVE
Leukocyte Esterase: NEGATIVE
NITRITE: NEGATIVE
PROTEIN: NEGATIVE
Ph: 8 (ref 4.5–8.0)
RBC,UR: 1 /HPF (ref 0–5)
Specific Gravity: 1.006 (ref 1.003–1.030)

## 2013-10-20 ENCOUNTER — Observation Stay: Payer: Self-pay

## 2013-10-20 LAB — URINALYSIS, COMPLETE
Bilirubin,UR: NEGATIVE
Blood: NEGATIVE
Glucose,UR: NEGATIVE mg/dL (ref 0–75)
Ketone: NEGATIVE
Leukocyte Esterase: NEGATIVE
Nitrite: NEGATIVE
PH: 7 (ref 4.5–8.0)
Protein: NEGATIVE
RBC,UR: <1 /HPF (ref 0–5)
SPECIFIC GRAVITY: 1.017 (ref 1.003–1.030)

## 2013-10-25 ENCOUNTER — Ambulatory Visit: Payer: Self-pay | Admitting: Obstetrics and Gynecology

## 2013-10-25 LAB — CBC WITH DIFFERENTIAL/PLATELET
Basophil #: 0 10*3/uL (ref 0.0–0.1)
Basophil %: 0.3 %
EOS ABS: 0.1 10*3/uL (ref 0.0–0.7)
Eosinophil %: 0.8 %
HCT: 34.2 % — ABNORMAL LOW (ref 35.0–47.0)
HGB: 11.8 g/dL — AB (ref 12.0–16.0)
Lymphocyte #: 2.4 10*3/uL (ref 1.0–3.6)
Lymphocyte %: 22.4 %
MCH: 32.9 pg (ref 26.0–34.0)
MCHC: 34.6 g/dL (ref 32.0–36.0)
MCV: 95 fL (ref 80–100)
MONOS PCT: 7 %
Monocyte #: 0.7 x10 3/mm (ref 0.2–0.9)
NEUTROS PCT: 69.5 %
Neutrophil #: 7.3 10*3/uL — ABNORMAL HIGH (ref 1.4–6.5)
Platelet: 149 10*3/uL — ABNORMAL LOW (ref 150–440)
RBC: 3.59 10*6/uL — AB (ref 3.80–5.20)
RDW: 12.8 % (ref 11.5–14.5)
WBC: 10.5 10*3/uL (ref 3.6–11.0)

## 2013-10-26 ENCOUNTER — Inpatient Hospital Stay: Payer: Self-pay | Admitting: Obstetrics and Gynecology

## 2013-10-26 DIAGNOSIS — IMO0002 Reserved for concepts with insufficient information to code with codable children: Secondary | ICD-10-CM

## 2013-10-26 LAB — GC/CHLAMYDIA PROBE AMP

## 2013-10-27 LAB — HEMATOCRIT: HCT: 26 % — ABNORMAL LOW (ref 35.0–47.0)

## 2013-10-28 LAB — HEMATOCRIT: HCT: 26.9 % — AB (ref 35.0–47.0)

## 2013-10-28 LAB — PATHOLOGY REPORT

## 2013-10-29 ENCOUNTER — Inpatient Hospital Stay: Payer: Self-pay | Admitting: Obstetrics and Gynecology

## 2013-10-29 LAB — URINALYSIS, COMPLETE
BACTERIA: NONE SEEN
BILIRUBIN, UR: NEGATIVE
Glucose,UR: NEGATIVE mg/dL (ref 0–75)
KETONE: NEGATIVE
Nitrite: NEGATIVE
Ph: 7 (ref 4.5–8.0)
Protein: NEGATIVE
RBC,UR: 1 /HPF (ref 0–5)
Specific Gravity: 1.011 (ref 1.003–1.030)
Squamous Epithelial: 8
WBC UR: 4 /HPF (ref 0–5)

## 2013-10-29 LAB — COMPREHENSIVE METABOLIC PANEL
Albumin: 2.4 g/dL — ABNORMAL LOW (ref 3.4–5.0)
Alkaline Phosphatase: 130 U/L — ABNORMAL HIGH
Anion Gap: 7 (ref 7–16)
BILIRUBIN TOTAL: 0.4 mg/dL (ref 0.2–1.0)
BUN: 5 mg/dL — ABNORMAL LOW (ref 7–18)
CALCIUM: 8.2 mg/dL — AB (ref 8.5–10.1)
CHLORIDE: 106 mmol/L (ref 98–107)
Co2: 26 mmol/L (ref 21–32)
Creatinine: 0.64 mg/dL (ref 0.60–1.30)
EGFR (African American): >60
GLUCOSE: 80 mg/dL (ref 65–99)
Osmolality: 274 (ref 275–301)
Potassium: 3.8 mmol/L (ref 3.5–5.1)
SGOT(AST): 25 U/L (ref 15–37)
SGPT (ALT): 23 U/L (ref 12–78)
SODIUM: 139 mmol/L (ref 136–145)
TOTAL PROTEIN: 6 g/dL — AB (ref 6.4–8.2)

## 2013-10-29 LAB — CBC WITH DIFFERENTIAL/PLATELET
Basophil #: 0 10*3/uL (ref 0.0–0.1)
Basophil %: 0.3 %
EOS PCT: 0.9 %
Eosinophil #: 0.1 10*3/uL (ref 0.0–0.7)
HCT: 32.7 % — AB (ref 35.0–47.0)
HGB: 10.9 g/dL — AB (ref 12.0–16.0)
LYMPHS PCT: 14.3 %
Lymphocyte #: 2.3 10*3/uL (ref 1.0–3.6)
MCH: 32.3 pg (ref 26.0–34.0)
MCHC: 33.4 g/dL (ref 32.0–36.0)
MCV: 97 fL (ref 80–100)
Monocyte #: 0.9 x10 3/mm (ref 0.2–0.9)
Monocyte %: 5.7 %
NEUTROS PCT: 78.8 %
Neutrophil #: 12.4 10*3/uL — ABNORMAL HIGH (ref 1.4–6.5)
Platelet: 212 10*3/uL (ref 150–440)
RBC: 3.38 10*6/uL — ABNORMAL LOW (ref 3.80–5.20)
RDW: 13 % (ref 11.5–14.5)
WBC: 15.7 10*3/uL — ABNORMAL HIGH (ref 3.6–11.0)

## 2013-10-29 LAB — TSH: Thyroid Stimulating Horm: 0.583 u[IU]/mL

## 2013-10-29 LAB — HCG, QUANTITATIVE, PREGNANCY: BETA HCG, QUANT.: 538 m[IU]/mL — AB

## 2013-10-29 LAB — LIPASE, BLOOD: LIPASE: 61 U/L — AB (ref 73–393)

## 2013-10-31 LAB — CBC WITH DIFFERENTIAL/PLATELET
BASOS ABS: 0 10*3/uL (ref 0.0–0.1)
Basophil %: 0.1 %
EOS ABS: 0.1 10*3/uL (ref 0.0–0.7)
Eosinophil %: 0.4 %
HCT: 31.5 % — ABNORMAL LOW (ref 35.0–47.0)
HGB: 10.7 g/dL — AB (ref 12.0–16.0)
Lymphocyte #: 1.6 10*3/uL (ref 1.0–3.6)
Lymphocyte %: 9.3 %
MCH: 32.4 pg (ref 26.0–34.0)
MCHC: 34 g/dL (ref 32.0–36.0)
MCV: 95 fL (ref 80–100)
MONO ABS: 1.2 x10 3/mm — AB (ref 0.2–0.9)
Monocyte %: 6.7 %
NEUTROS ABS: 14.8 10*3/uL — AB (ref 1.4–6.5)
Neutrophil %: 83.5 %
PLATELETS: 298 10*3/uL (ref 150–440)
RBC: 3.3 10*6/uL — AB (ref 3.80–5.20)
RDW: 12.6 % (ref 11.5–14.5)
WBC: 17.8 10*3/uL — AB (ref 3.6–11.0)

## 2013-11-01 LAB — CBC WITH DIFFERENTIAL/PLATELET
Basophil #: 0 10*3/uL (ref 0.0–0.1)
Basophil %: 0.1 %
Eosinophil #: 0.1 10*3/uL (ref 0.0–0.7)
Eosinophil %: 1.1 %
HCT: 27.6 % — AB (ref 35.0–47.0)
HGB: 9.3 g/dL — AB (ref 12.0–16.0)
LYMPHS PCT: 19.7 %
Lymphocyte #: 2.4 10*3/uL (ref 1.0–3.6)
MCH: 31.9 pg (ref 26.0–34.0)
MCHC: 33.8 g/dL (ref 32.0–36.0)
MCV: 94 fL (ref 80–100)
MONO ABS: 1.3 x10 3/mm — AB (ref 0.2–0.9)
Monocyte %: 10.2 %
NEUTROS PCT: 68.9 %
Neutrophil #: 8.5 10*3/uL — ABNORMAL HIGH (ref 1.4–6.5)
Platelet: 292 10*3/uL (ref 150–440)
RBC: 2.93 10*6/uL — ABNORMAL LOW (ref 3.80–5.20)
RDW: 12.5 % (ref 11.5–14.5)
WBC: 12.4 10*3/uL — AB (ref 3.6–11.0)

## 2013-11-02 LAB — CBC WITH DIFFERENTIAL/PLATELET
Basophil #: 0.1 10*3/uL (ref 0.0–0.1)
Basophil %: 0.5 %
Eosinophil #: 0.3 10*3/uL (ref 0.0–0.7)
Eosinophil %: 2.9 %
HCT: 26.3 % — ABNORMAL LOW (ref 35.0–47.0)
HGB: 9.1 g/dL — AB (ref 12.0–16.0)
LYMPHS ABS: 3.1 10*3/uL (ref 1.0–3.6)
Lymphocyte %: 30.4 %
MCH: 32.8 pg (ref 26.0–34.0)
MCHC: 34.7 g/dL (ref 32.0–36.0)
MCV: 94 fL (ref 80–100)
MONO ABS: 1 x10 3/mm — AB (ref 0.2–0.9)
Monocyte %: 10 %
NEUTROS PCT: 56.2 %
Neutrophil #: 5.7 10*3/uL (ref 1.4–6.5)
Platelet: 317 10*3/uL (ref 150–440)
RBC: 2.78 10*6/uL — ABNORMAL LOW (ref 3.80–5.20)
RDW: 12.5 % (ref 11.5–14.5)
WBC: 10.1 10*3/uL (ref 3.6–11.0)

## 2013-11-03 LAB — CULTURE, BLOOD (SINGLE)

## 2013-11-07 LAB — WOUND CULTURE

## 2014-06-06 ENCOUNTER — Encounter (HOSPITAL_COMMUNITY): Payer: Self-pay | Admitting: Emergency Medicine

## 2014-07-02 IMAGING — CR DG ABDOMEN 3V
1 series · 5 of 5 positions shown · non-contrast
Comparison: DG ABDOMEN 1V dated 10/26/2013

CLINICAL DATA: Severe abdominal pain.  Recent Caesarian section.

EXAM:
ABDOMEN SERIES

[Series 1: w chest pa · 0.14mm/px · 5 of 5 slices shown]
[im 1/5]
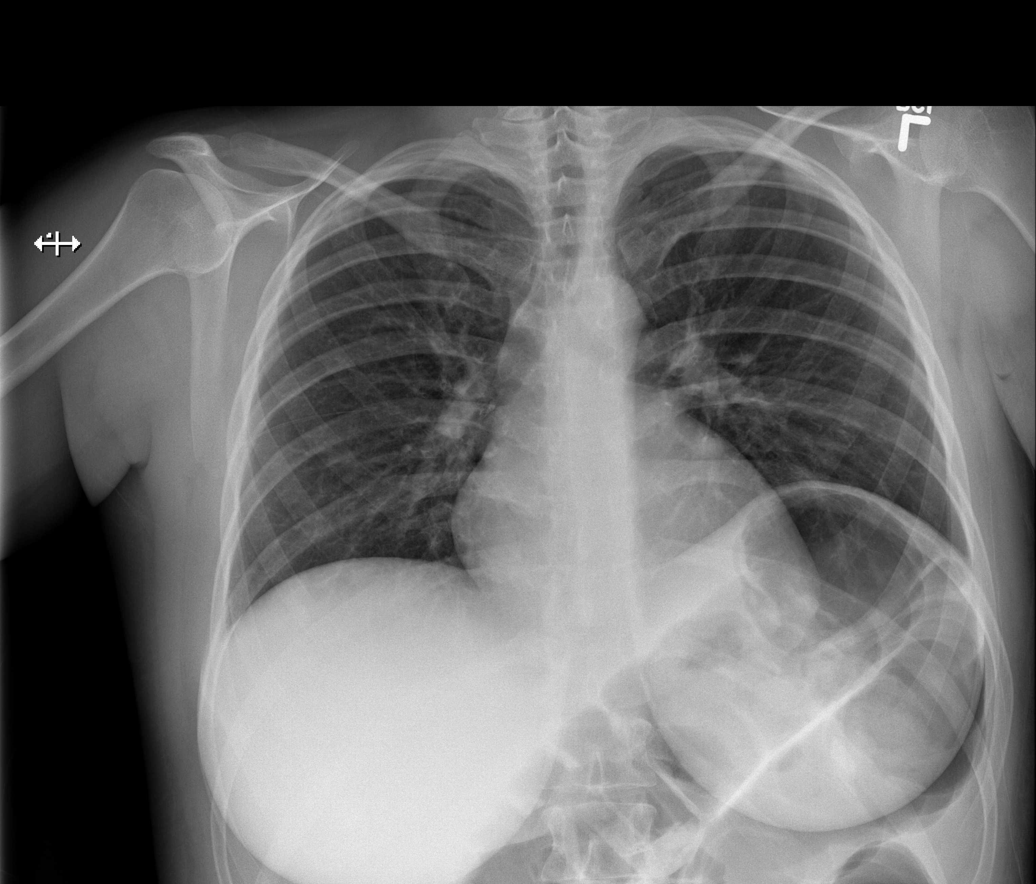
[im 2/5]
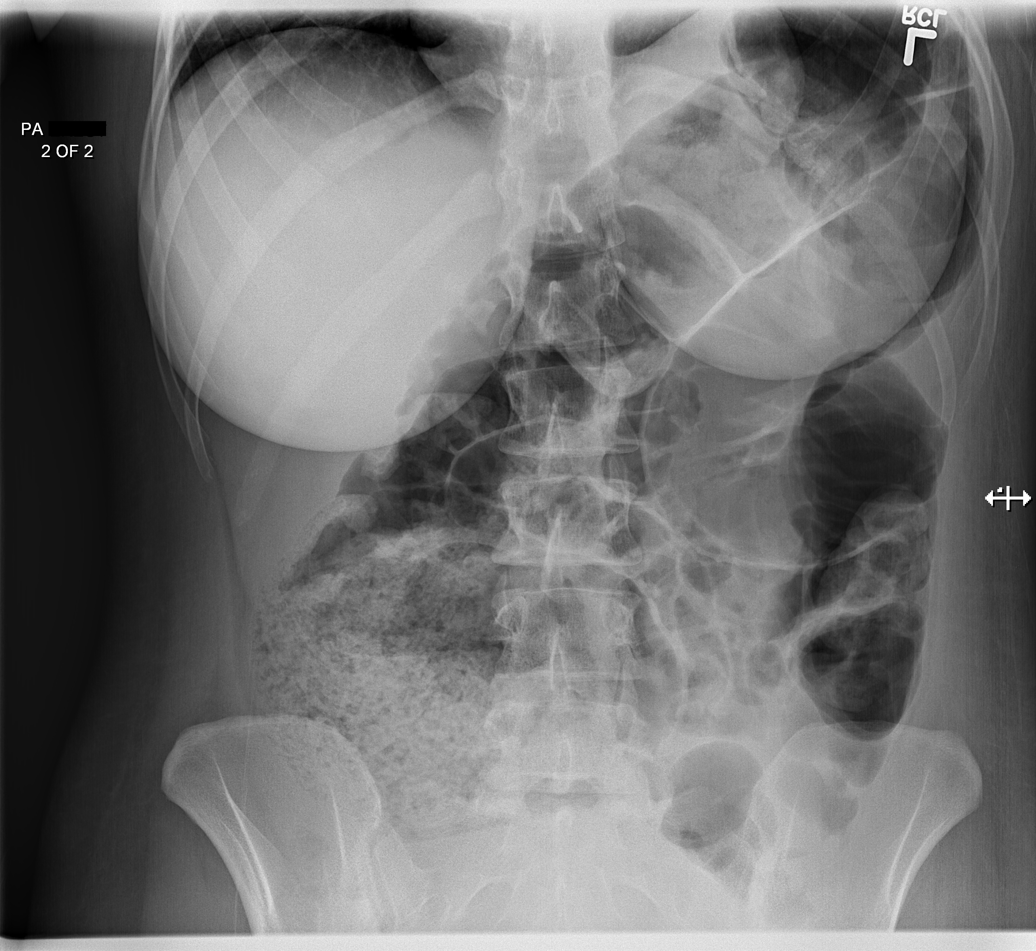
[im 3/5]
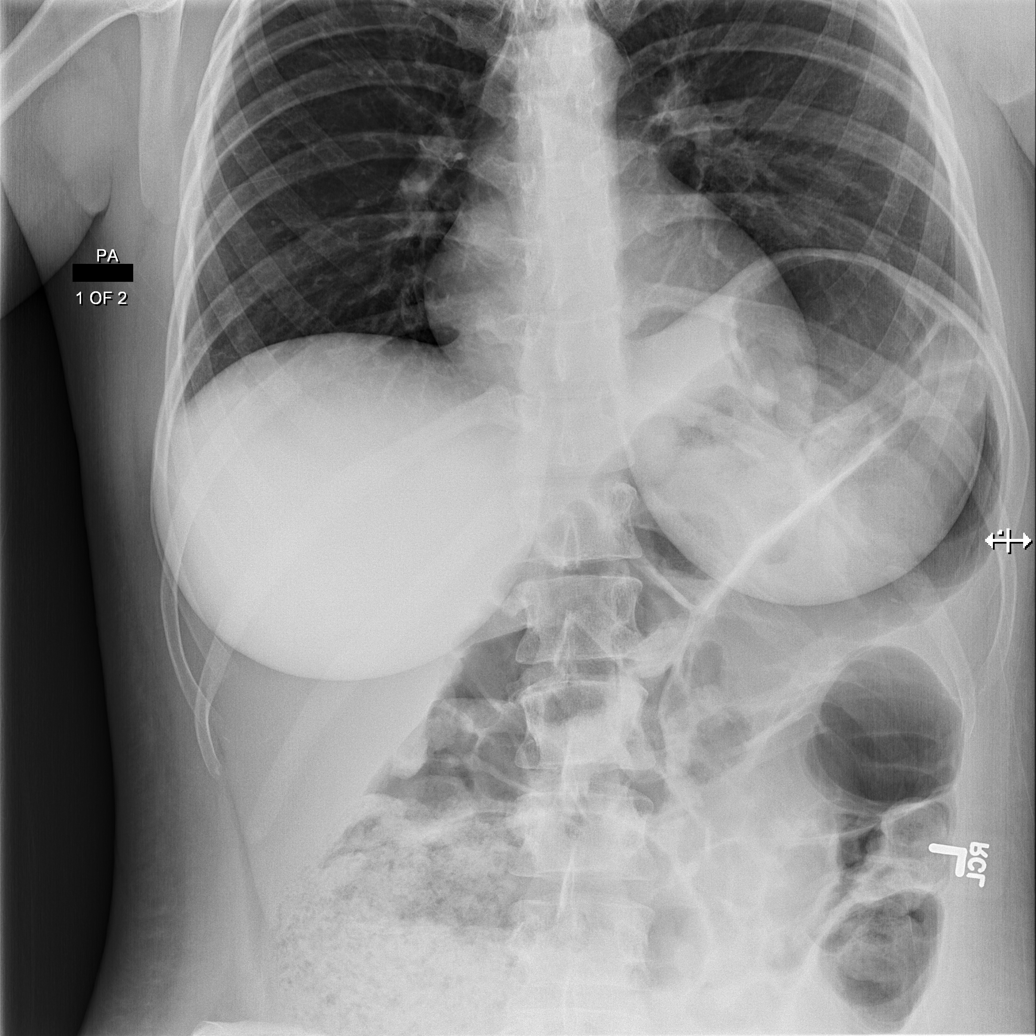
[im 4/5]
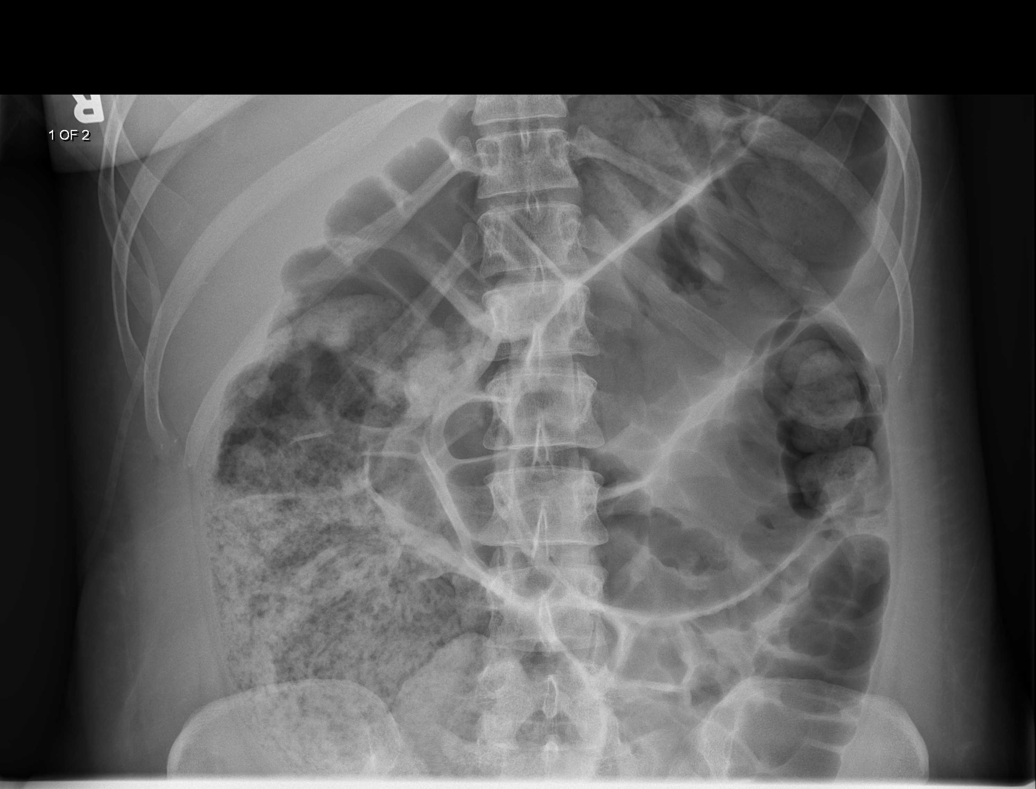
[im 5/5]
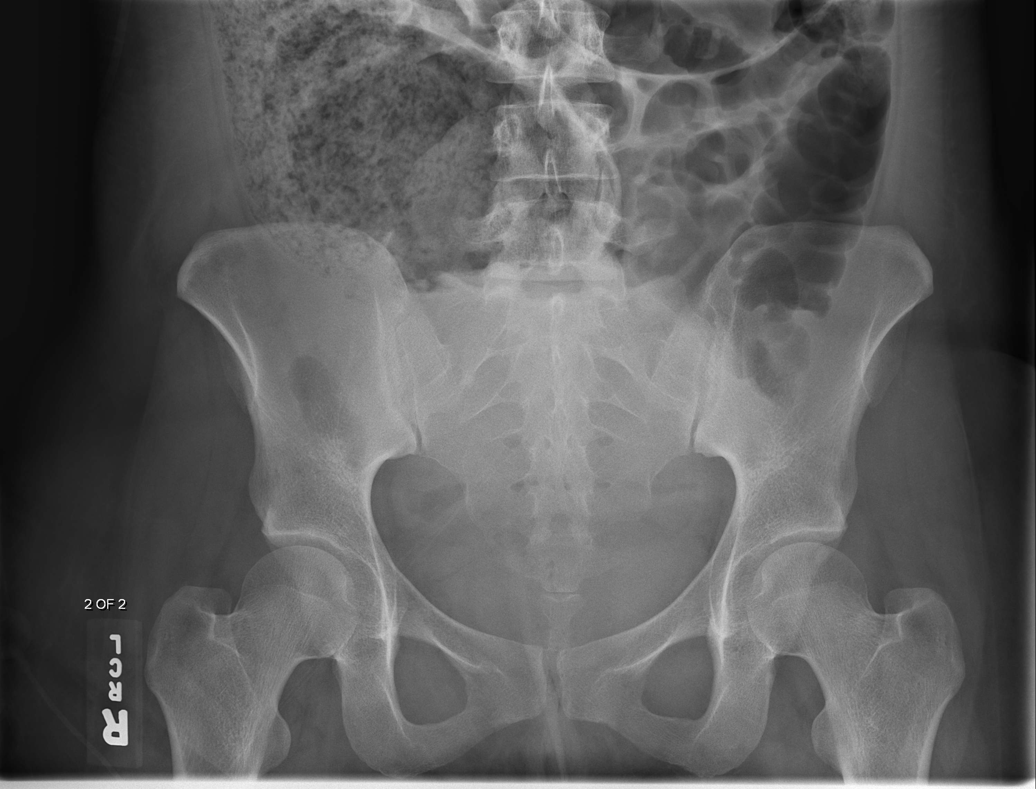

[5 of 5 positions shown; findings below may reference images not displayed]

FINDINGS: Cardiomediastinal silhouette is unremarkable. Lungs are clear, no
pleural effusions. No pneumothorax. Soft tissue planes and included
osseous structures are unremarkable.

Cecum is dilated to 11 cm with stool. Multiple loops of gas
distended small and large bowel, descending colon is redundant,
distended is a 6.5 cm with paucity of bowel gas at the level the
rectum. No pathologic calcifications. No intraperitoneal free air.
Soft tissue planes and included osseous structures are
nonsuspicious.
IMPRESSION: Stool distended cecum to 11 cm, with gaseous distended small and
large bowel favoring ileus, paucity of bowel gas at level of the
rectum could reflect fecal impaction.

  By: Caleb Aujla

## 2014-08-08 ENCOUNTER — Emergency Department: Payer: Self-pay | Admitting: Emergency Medicine

## 2014-08-08 LAB — URINALYSIS, COMPLETE
BACTERIA: NONE SEEN
Bilirubin,UR: NEGATIVE
Blood: NEGATIVE
Glucose,UR: NEGATIVE mg/dL (ref 0–75)
LEUKOCYTE ESTERASE: NEGATIVE
Nitrite: NEGATIVE
Ph: 5 (ref 4.5–8.0)
Protein: NEGATIVE
Specific Gravity: 1.021 (ref 1.003–1.030)
WBC UR: 2 /HPF (ref 0–5)

## 2014-11-22 NOTE — Op Note (Signed)
PATIENT NAME:  [name], [name] MR#:  [id number] DATE OF BIRTH:  04-Apr-1991  DATE OF PROCEDURE:  02/25/2012  PREOPERATIVE DIAGNOSIS: Intrauterine pregnancy, previous cesarean section, labor.   POSTOPERATIVE DIAGNOSIS: Intrauterine pregnancy, previous cesarean section, labor.   PROCEDURE PERFORMED: Low transverse cesarean section.   SURGEON: Kate SablePhilip Rosenow, MD  FIRST ASSISTANT: Farrel Connersolleen Gutierrez, CNM  NEONATOLOGIST: Margaretmary EddyJohn Galiote, MD  OPERATIVE FINDINGS: 6 pounds, 11 ounce female infant - Kamyla, delivered at 11:53 a.[name].   DESCRIPTION OF PROCEDURE: After adequate conduction anesthesia, the patient was prepped and draped in routine fashion. A skin incision in modified Pfannenstiel fashion was made and carried down the various layers and the peritoneal cavity was entered. A bladder flap was created and the bladder was pushed down. A low transverse incision was made and the above-described infant was delivered without difficulty. The placenta was removed manually. The cervix was "cracked". The uterus was then closed with continuous lock suture of chromic 1. The pelvis was lavaged with copious amounts of saline. All areas of surgery were seen to be hemostatic. Interceed was placed. The On-Q pain system was applied. The fascia was             reapproximated with continuous suture of Maxon with care being taken to not incorporate the On-Q apparatus in the incision. The skin was closed with skin staples. Estimated blood loss 500 mL.  ____________________________ Deloris PingPhilip J. Luella Cookosenow, MD pjr:slb D: 02/25/2012 12:27:00 ET (Entered as incorrect work type - 02) T: 02/25/2012 12:38:46 ET JOB#: [id number]  cc: [facility] Towana BadgerPHILIP J ROSENOW MD ELECTRONICALLY SIGNED 02/26/2012 20:47

## 2014-11-26 NOTE — Op Note (Signed)
PATIENT NAME:  [name], [name] MR#:  [id number] DATE OF BIRTH:  1991-06-26  DATE OF PROCEDURE:  10/26/2013  PREOPERATIVE DIAGNOSIS: Fetal distress after going to the bathroom and arriving on the day of a scheduled cesarean section.  POSTOPERATIVE DIAGNOSIS:  Fetal distress after going to the bathroom and arriving on the day of a scheduled cesarean section with no reason found for fetal distress.   PROCEDURE: Tertiary lower uterine tract cesarean section.   ESTIMATED BLOOD LOSS: 1000 mL.   FINDINGS: Term liveborn female infant needing minor resuscitation with a pH of 6.97.   DESCRIPTION OF PROCEDURE: The patient was taken to the operating room quickly and placed in supine position. Incision was injected with lidocaine. Adequate general anesthesia was instilled. The patient was quickly prepped and draped in the usual sterile fashion with Betadine placed on the belly. Pfannenstiel skin incision was made through the old scar and carried sharply down to the fascia. The fascia was nicked in the midline and the curved Mayo scissors were used to cut through the scar tissue that was quite extensive. The rectus fascia was nicked and extended in a superolateral manner. Kochers were used to grasp the anterior rectus fascia. The rectus fascia was sharply and bluntly dissected off the rectus muscles. The midline and the muscle bellies was identified. These were split open. Peritoneum was grasped, cut and entered. Bladder blade was placed and uterine incision was made. The infant's head was delivered and the remainder of the body was delivered. Cord was clamped and cut. Infant was handed off to the awaiting pediatrician. Another section of cord was clamped and cut for cord gas. Pitocin was then started. The interior of the uterus was curetted with moist lap sponge after the placenta had delivered. There were no signs of abruption. No signs of separation. No blood in the amniotic fluid. No cord around the fetus' neck,  arms or legs. The uterus was then delivered and placed in a moist lap sponge. Pennington's were placed on the uterine incision. The bladder was removed from the incision area. A running chromic suture was placed in a locked running fashion and then an imbricating suture was placed of chromic over this. A 3-0 chromic was then used to tack back the bladder to the uterine incision. The belly was cleared of clots. The uterus was placed back into the abdomen and the gutters were cleared of all clots and fluid. The muscle bellies were approximated with a running Vicryl suture. The On-Q trocars were then placed. The catheters were threaded and wrapped on top of the muscle bellies and underneath the fascia. The fascia was closed with a running Vicryl suture. 4-0 Monocryl was used to close the skin edges. Benzoin and Steri-Strips were placed. The On-Q catheters were stabilized. With Dermabond on the skin, a 4 x 4 was placed. The catheters were wrapped inside this. Steri-Strips and Tegaderm were placed on for permanent. The uterus was found to be firm. Clear urine was noted in the Foley bag and the patient was taken to recovery after having tolerated the procedure well.    ___________________________ Elliot Gurneyarrie C. Latricia Cerrito, MD cck:ce D: 11/02/2013 17:15:00 ET T: 11/02/2013 20:16:15 ET JOB#: [id number]  cc: Elliot Gurneyarrie C. Maddux Vanscyoc, MD, <Dictator>  Elliot GurneyARRIE C Moksha Dorgan MD ELECTRONICALLY SIGNED 11/09/2013 14:43

## 2014-11-26 NOTE — Op Note (Signed)
PATIENT NAME:  [name], [name] MR#:  [id number] DATE OF BIRTH:  06-Nov-1990  DATE OF PROCEDURE:  11/02/2013  PREOPERATIVE DIAGNOSIS: Fetal distress after going to the bathroom and arriving on the day of a scheduled cesarean section.  POSTOPERATIVE DIAGNOSIS:  Fetal distress after going to the bathroom and arriving on the day of a scheduled cesarean section with no reason found for fetal distress.   PROCEDURE: Tertiary lower uterine tract cesarean section.   ESTIMATED BLOOD LOSS: 1000 mL.   FINDINGS: Term liveborn female infant needing minor resuscitation with a pH of 6.97.   DESCRIPTION OF PROCEDURE: The patient was taken to the operating room quickly and placed in supine position. Incision was injected with lidocaine. Adequate general anesthesia was instilled. The patient was quickly prepped and draped in the usual sterile fashion with Betadine placed on the belly. Pfannenstiel skin incision was made through the old scar and carried sharply down to the fascia. The fascia was nicked in the midline and the curved Mayo scissors were used to cut through the scar tissue that was quite extensive. The rectus fascia was nicked and extended in a superolateral manner. Kochers were used to grasp the anterior rectus fascia. The rectus fascia was sharply and bluntly dissected off the rectus muscles. The midline and the muscle bellies was identified. These were split open. Peritoneum was grasped, cut and entered. Bladder blade was placed and uterine incision was made. The infant's head was delivered and the remainder of the body was delivered. Cord was clamped and cut. Infant was handed off to the awaiting pediatrician. Another section of cord was clamped and cut for cord gas. Pitocin was then started. The interior of the uterus was curetted with moist lap sponge after the placenta had delivered. There were no signs of abruption. No signs of separation. No blood in the amniotic fluid. No cord around the fetus neck,  arms or legs. The uterus was then delivered and placed in a moist lap sponge. Pennington's were placed on the uterine incision. The bladder was removed from the incision area. A running chromic suture was placed in a locked running fashion and then an imbricating suture was placed of chromic over this. A 3-0 chromic was then used to tack back the bladder to the uterine incision. The belly was cleared of clots. The uterus was placed back into the abdomen and the gutters were cleared of all clots and fluid. The muscle bellies were approximated with a running Vicryl suture. The On-Q trocars were then placed. The catheters were threaded and wrapped on top of the muscle bellies and underneath the fascia. The fascia was closed with a running Vicryl suture. 4-0 Monocryl was used to close the skin edges. Benzoin and Steri-Strips were placed. The On-Q catheters were stabilized. With Dermabond on the skin, a 4 x 4 was placed. The catheters were wrapped inside this. Steri-Strips and Tegaderm were placed on for permanent. The uterus was found to be firm. Clear urine was noted in the Foley bag and the patient was taken to recovery after having tolerated the procedure well.    ___________________________ Elliot Gurneyarrie C. Geramy Lamorte, MD cck:ce D: 11/02/2013 17:15:57 ET T: 11/02/2013 20:16:15 ET JOB#: [id number]  cc: Elliot Gurneyarrie C. Alexei Ey, MD, <Dictator>

## 2014-11-26 NOTE — Consult Note (Signed)
PATIENT NAME:  Gevena MartHOWELL, Naveen M MR#:  [id number] DATE OF BIRTH:  [birth year]  DATE OF CONSULTATION:  10/29/2013  REFERRING PHYSICIAN: Thomasene MohairStephen Jackson, MD CONSULTING PHYSICIAN:  Cletis Athensavid K. Linley Moxley, MD  REASON FOR CONSULTATION: Constipation.  HISTORY OF PRESENT ILLNESS: This is a 24 year old Caucasian female who is presenting for abdominal pain. She is postoperative day 3 after a C-section which was scheduled. She is now complaining of abdominal pain of 1-day duration with associated constipation. She describes her abdominal pain as 10/10 in intensity, located suprapubic and periumbilical without radiation. No worsening or relieving factors. Describes this as a stabbing and pressure-like in quality, She does note having constipation since 10/26/2013, which is an ongoing issue for her. She denies any nausea, vomiting, or decreased p.o. intake. She does denote having possible subjective fever, chills; however, denies any further symptoms.  HOSPITAL COURSE: Thus far in the ER she received Zosyn for antibiotic coverage, as well as an enema with some response. I am being asked to consult for continued management of constipation and abdominal pain.   REVIEW OF SYSTEMS:  CONSTITUTIONAL: Positive for chills, fevers, weakness. EYES: Denies blurry, double vision, or eye pain.  HEENT: Denies tinnitus, ear pain, hearing loss.  RESPIRATORY: Denies cough, wheeze, shortness of breath. CARDIOVASCULAR: Denies chest pain, palpitations, edema.  GASTROINTESTINAL: Denies nausea, vomiting, diarrhea. Positive for constipation as well as abdominal pain.  GENITOURINARY: Denies dysuria or hematuria.  ENDOCRINE: Denies nocturia or thyroid problems.  HEMATOLOGIC AND LYMPHATIC: Denies easy bruising, bleeding. SKIN: Denies rash or lesions. MUSCULOSKELETAL: Denies pain in neck, back, shoulders, knees or hips.  NEUROLOGIC: Denies paralysis or paresthesias.  PSYCHIATRIC: Denies anxiety or depressive symptoms.   Otherwise, full  review of systems performed is negative.   PAST MEDICAL HISTORY: Asthma as well as anemia.   SOCIAL HISTORY: Every day tobacco use. Denies alcohol or drug use.   FAMILY HISTORY: Positive for coronary artery disease, diabetes, and hypothyroidism.   ALLERGIES: No known drug allergies.   HOME MEDICATIONS: She is taking ibuprofen 600 mg p.o. q. 6 hours as needed for pain, Percocet 5/325 one mg every 6 hours as needed for pain, which she has actually taking every 4 hours, ferrous sulfate 325 one tab p.o. b.i.d., as well as Gas-X Extra Strength 1 tablet p.o. b.i.d. as needed for indigestion.   PHYSICAL EXAMINATION:  VITAL SIGNS: Current temperature 98.5, T-max of 100.4, heart rate 76, respirations 18, blood pressure 101/50, saturating 95% on room air. Weight 72.6 kg. BMI 28.4.  GENERAL: Well-nourished, well-developed, Caucasian female, currently in minimal-to-moderate distress secondary to abdominal pain.  HEAD: Normocephalic, atraumatic.  EYES: Pupils equal, round, reactive to light. Extraocular muscles intact. No scleral icterus.  MOUTH: Moist mucous membranes.  Dentition poor. No abscess noted.  EARS, NOSE, AND THROAT: Clear without exudates. No external lesions.  NECK: Supple. No thyromegaly. No nodules. No JVD.  PULMONARY: Clear to auscultation bilaterally without wheezes, rales, or rhonchi. No use of accessory muscles. Good respiratory effort.  CHEST:  Nontender on palpation.  CARDIOVASCULAR: S1, S2, regular rate and rhythm. No murmurs, rubs, or gallops. No edema. Pedal pulses 2+ bilaterally.  GASTROINTESTINAL: Soft, tender to palpation periumbilical as suprapubic with associated voluntary guarding. No motion tenderness. Incision has no surrounding erythema, edema, or discharge at this time. The abdomen is nondistended, no masses. Hypoactive bowel sounds. No hepatosplenomegaly.  MUSCULOSKELETAL: No swelling, clubbing, or edema. Range of motion is full in all extremities. NEUROLOGIC: Cranial  nerves II through XII intact. No gross neurologic deficits. Sensation  intact. Reflexes intact.  SKIN: No ulcerations, lesions, no rashes, or cyanosis other than incision as described above. Skin warm and dry. Turgor intact.  PSYCHIATRIC: Mood and affect within normal limits. The patient alert and oriented x 3. Insight and judgment intact.   LABORATORY DATA: Sodium 139, potassium 3.8, chloride 106, bicarbonate 26, BUN 5, creatinine 0.64, glucose 80, lipase 61, hCG of 538. LFTs: Total protein 670 and 2.4, bilirubin of 0.4, alkaline phosphatase 130, AST 25, ALT 23. WBC 15.7, hemoglobin 10.9, platelets of 212,000. Urinalysis negative for evidence of infection.   She had abdominal and chest film performed revealing a stool-distended cecum to 11 cm with  distention of the small and large bowel favoring an ileus and has had an OB ultrasound revealing markedly thickened endometrium with internal blood flow findings concerning for retained products of conception.   ASSESSMENT AND PLAN: A 24 year old Caucasian female postoperative day 3 from a cesarean section presenting with abdominal pain and constipation.  1. Systemic inflammatory response syndrome meeting systemic inflammatory response syndrome criteria by temperature as well as leukocytosis, possibly related to constipation. However, noted possible retained products on ultrasound. She received Zosyn for antibiotic coverage in the Emergency Department. We will continue to follow culture data, defer management of possible retained products to the primary team, obstetrics and gynecology. 2. Constipation, multifactorial given postoperative ileus as well as opiate-induced constipation. She had an enema with some results. Since she is tolerating p.o. she will be allowed to have a clear liquid diet and for bowel regimen, will place Senna-S 2 tablets p.o. b.i.d. and MiraLax 17 grams p.o. b.i.d. If no results, will need a repeat enema. Ideally we will minimize narcotics  as feasible. 3. Mildly elevated alkaline phosphatase. Follow liver function tests. If not resolving, will need a right upper quadrant ultrasound.  4. Venous thromboembolism prophylactic deferred to primary team.  CODE STATUS: The patient is full code.   TIME SPENT: 45 minutes.   ____________________________ Cletis Athens. Menno Vanbergen, MD dkh:lt D: 10/29/2013 21:47:14 ET T: 10/29/2013 22:37:56 ET JOB#: [id number]  cc: Cletis Athens. Alissia Lory, MD, <Dictator> Leaann Nevils Synetta Shadow MD ELECTRONICALLY SIGNED 10/30/2013 0:52

## 2014-12-13 NOTE — H&P (Signed)
L&D Evaluation:  History Expanded:  HPI 24yo [id number] at 34w 3days who presents with contractions that she has had this morning. She is an [facility] patient, history of c/s x 2 and Hx of PTD at 35 weeks and refused 17OHP, she is a heavy caffeine user, has ice pica, is a smoker, has anxiety and depression.hx self mutilation, past hx asthma, plans to BF, O POS/VI/RUBI-got MMR 94 and 98. got flu vaccine on 10/13. desires mirena post partum.   Gravida 5   Term 1   PreTerm 1   Abortion 2   Living 2   Blood Type (Maternal) O positive   Group B Strep Results Maternal (Result >5wks must be treated as unknown) unknown/result > 5 weeks ago   Maternal HIV Negative   Maternal Syphilis Ab Nonreactive   Maternal Varicella Immune   Rubella Results (Maternal) immune   EDC 02-Nov-2013   Presents with contractions   Patient's Medical History Asthma  anxiety and depression,   Patient's Surgical History Previous C-Section  x2,   Medications Pre Natal Vitamins   Allergies NKDA   Social History tobacco  caffeine   Family History Non-Contributory   ROS:  ROS All systems were reviewed.  HEENT, CNS, GI, GU, Respiratory, CV, Renal and Musculoskeletal systems were found to be normal.   Exam:  Vital Signs stable   Urine Protein not completed   General no apparent distress   Mental Status clear   Chest clear   Heart normal sinus rhythm   Abdomen gravid, non-tender   Estimated Fetal Weight Average for gestational age   Fetal Position v   Back no CVAT   Edema no edema   Pelvic no external lesions   Mebranes Intact   FHT normal rate with no decels, cat 1   Ucx irregular   Skin dry   Lymph no lymphadenopathy   Impression:  Impression preterm contraction, no change in cervix   Plan:  Plan EFM/NST, monitor contractions and for cervical change   Comments terb x 1, continue to monitor, recjk   Electronic Signatures: Erik Obey (MD)  (Signed 20-Feb-15  16:50)  Authored: L&D Evaluation   Last Updated: 20-Feb-15 16:50 by Erik Obey (MD)

## 2014-12-13 NOTE — H&P (Signed)
L&D Evaluation:  History Expanded:  HPI 24yo [id number] who is post-op day # 3 s/p emergency cesarean section for fetal distress. Her post-operative course was complicted by a acute blood loss anemia to a hematocrit of 26%, which remained stable during her hospitalization.  She strongly desired discharge yesterday and given her good pain control and stable blood counts she was discharge.  She presents to the ED today with worsening pain in her abdomen that started about 2 hours after she returned home yesterday evening.  She states that she has been unable to have a bowel movement since prior to delivery and that constipation has always been a big issue for her.  She also states that she has been unable to eat or drink anything due to her abdominal pain. She denies nausea and vomiting.  She states that she felt like she had a fever last night but didn't check her actual temperature at home.  She states that her incision is sore and it make her hard for her to bend her back.  Since returning home yesterday evening she was taking 1 percocet every 4 hours. The last one she took was at 630 this morning.  She has had minimal vaginal bleeding.   Presents with abdominal pain   Patient's Medical History 1) asthma,- mild intermitent 2) anxiety, 3) depression   Patient's Surgical History 1) cesarean section x 3, 2) tonsillecotmy   Medications percocet, ibuprofen, occasional albuterol inhaler   Allergies NKDA   Social History 2 cigarettes per day (more prior to pregnancy), denies EtOH and drug use   Family History Non-Contributory   ROS:  ROS All systems were reviewed.  HEENT, CNS, GI, GU, Respiratory, CV, Renal and Musculoskeletal systems were found to be normal., very thirsty, hurts to urinate, otherwise negative   Exam:  Vital Signs T 98.6, BP 135/80, P 99, RR 18, O2 sats 98%RA   General disheveled, in moderate distress, takes long pauses to answer questions, is tearful   Chest mild wheezes in LLL,  otherwise clear (exam limited by patient's inability to move)   Heart normal sinus rhythm, 2/6 systolic ejection murmur   Abdomen Soft, scant BS, patient is very tender to palpation even to light touch throughout the abdomen, worse in epigastric area in left flank.  incision is clean, dry, and intact without erythema, induration, warmth, or tenderness   Edema no edema   Skin dry   Other 1) Abdominal plain film series: (REPORT 10/29/13) FINDINGS: Cardiomediastinal silhouette is unremarkable. Lungs are clear, no pleural effusions. No pneumothorax. Soft tissue planes and included osseous structures are unremarkable.  Cecum is dilated to 11 cm with stool. Multiple loops of gas distended small and large bowel, descending colon is redundant, distended is a 6.5 cm with paucity of bowel gas at the level the rectum. No pathologic calcifications. No intraperitoneal free air. Soft tissue planes and included osseous structures are nonsuspicious.   IMPRESSION: Stool distended cecum to 11 cm, with gaseous distended small and large bowel favoring ileus, paucity of bowel gas at level of the rectum could reflect fecal impaction.  Pelvic U/S REPORT 10/29/13 FINDINGS: Uterus measures 16.0 x 9.3 x 11.7 cm. No focal intramural abnormality.  Endometrium is thickened and heterogeneous, measuring up to 5.9 cm in thickness. Internal blood flow noted. Findings concerning for retained products of conception.  Neither ovary are visualized. No adnexal masses. Trace free fluid in the pelvis.   IMPRESSION: Markedly thickened endometrium with internal blood flow. Findings concerning for retained  products of conception.   Impression:  Impression 1) ileus versus constipation with significant stool retention, 2) potential retention of products of conception with no signs of hemorrhage or uterine infection   Plan:  Comments 1) admit to assist with return of bowel function.  Disimpaction and an enema have  been ordered and performed in the ER.  Unsure of results at this time.  Will follow up. 2) Will consult gen med to assist in assessing ilius versus chronic constipation and make recommendations regarding continued bowel clean-out and ongoing maintenance of bowel function. 3) will monitor for signs of infection and hemorrhage.  However, the findings on ultrasound or unlikely to be contributing to the severity and symptoms the patient is now experiencing in the absence of other findings (Fever, etc).   Labs:  Lab Results:  Hepatic:  27-Mar-15 11:51   Bilirubin, Total 0.4  Alkaline Phosphatase  130 (45-117 NOTE: New Reference Range 06/25/13)  SGPT (ALT) 23  SGOT (AST) 25  Total Protein, Serum  6.0  Albumin, Serum  2.4  Routine Chem:  27-Mar-15 11:51   HCG Betasubunit Quant. Serum  538 (1-3  (International Unit)  ----------------- Non-pregnant <5 Weeks Post LMP mIU/mL  3- 4 wk 9 - 130  4- 5 wk 75 - 2,600  5- 6 wk 850 - 20,800  6- 7 wk 4,000 - 100,000  7-12 wk 11,500 - 289,000 12-16 wk 18,000 - 137,000 16-29 wk 1,400 - 53,000 29-41 wk 940 - 60,000)  Glucose, Serum 80  BUN  5  Sodium, Serum 139  Potassium, Serum 3.8  Chloride, Serum 106  CO2, Serum 26  Calcium (Total), Serum  8.2  Osmolality (calc) 274  eGFR (African American) >60  eGFR (Non-African American) >60 (eGFR values <40m/min/1.73 m2 may be an indication of chronic kidney disease (CKD). Calculated eGFR is useful in patients with stable renal function. The eGFR calculation will not be reliable in acutely ill patients when serum creatinine is changing rapidly. It is not useful in  patients on dialysis. The eGFR calculation may not be applicable to patients at the low and high extremes of body sizes, pregnant women, and vegetarians.)  Anion Gap 7  Lipase  61 (Result(s) reported on 29 Oct 2013 at 12:18PM.)  Routine UA:  27-Mar-15 11:51   Color (UA) Yellow  Clarity (UA) Clear  Glucose (UA) Negative  Bilirubin  (UA) Negative  Ketones (UA) Negative  Specific Gravity (UA) 1.011  Blood (UA) 2+  pH (UA) 7.0  Protein (UA) Negative  Nitrite (UA) Negative  Leukocyte Esterase (UA) Trace (Result(s) reported on 29 Oct 2013 at 12:39PM.)  RBC (UA) 1 /HPF  WBC (UA) 4 /HPF  Bacteria (UA) NONE SEEN  Epithelial Cells (UA) 8 /HPF (Result(s) reported on 29 Oct 2013 at 12:39PM.)  Routine Hem:  27-Mar-15 11:51   WBC (CBC)  15.7  RBC (CBC)  3.38  Hemoglobin (CBC)  10.9  Hematocrit (CBC)  32.7  Platelet Count (CBC) 212  MCV 97  MCH 32.3  MCHC 33.4  RDW 13.0  Neutrophil % 78.8  Lymphocyte % 14.3  Monocyte % 5.7  Eosinophil % 0.9  Basophil % 0.3  Neutrophil #  12.4  Lymphocyte # 2.3  Monocyte # 0.9  Eosinophil # 0.1  Basophil # 0.0 (Result(s) reported on 29 Oct 2013 at 12:05PM.)   Electronic Signatures: JWill Bonnet(MD)  (Signed 27-Mar-15 19:53)  Authored: L&D Evaluation, Labs   Last Updated: 27-Mar-15 19:53 by JWill Bonnet(MD)

## 2014-12-13 NOTE — H&P (Signed)
L&D Evaluation:  History:  HPI 24yo [id number] at 38w 1day who presents with back pain that "has been killing her x 2 week", and decreased FM x 2 days. Reports falling one wk ago on her buttock and hip. Denies VB or LOF. Has intermittent bouts of regular contractions over the past week. Tired, sometimes SOB, pain in left thigh with ctxs, and insomnia.She is an [facility] patient, history of c/s x 2 and Hx of PTD at 35 weeks and refused 17OHP, she is a heavy caffeine user, has ice pica, is a smoker (now 1-2 cigs/day), has anxiety and depression.Hx self mutilation, past hx asthma, plans to BF, O POS/VI/RUBI/ GBS positive-got MMR 94 and 98. got flu vaccine on 10/13. desires mirena post partum.   Presents with back pain and decreased FM.   Patient's Medical History Asthma  anxiety and depression,   Patient's Surgical History Previous C-Section  x2,   Medications Pre Natal Vitamins  Tylenol, Albuterol occasionally   Allergies NKDA   Social History tobacco  caffeine   Family History Non-Contributory   ROS:  ROS see HPI   Exam:  Vital Signs stable  113/66   Urine Protein UA ess negative   General no apparent distress   Mental Status clear   Chest clear   Heart normal sinus rhythm, no murmur/gallop/rubs   Abdomen gravid, non-tender   Fetal Position cephalic   Edema no edema   Pelvic no external lesions, Ft/60-70%/-1   Mebranes Intact   FHT normal rate with no decels, baseline 135 with accels to 150s to 160s   Ucx mild, irr   Skin dry   Impression:  Impression reactive NST, IUP at 38 1/7 weeks with MSK pain and discomforts of pregnancy.   Plan:  Plan DC home. Recommend heat to back, Max freeze, for pain. Work note to start maternity leave. RX for Ambien 5 mgm #6 tabs for insomnia-take one at hs prn. Preop appt 3/23. Labor precautions.   Electronic Signatures: Karene Fry (CNM)  (Signed 18-Mar-15 19:45)  Authored: L&D Evaluation   Last Updated: 18-Mar-15 19:45  by Karene Fry (CNM)

## 2015-01-30 ENCOUNTER — Inpatient Hospital Stay (HOSPITAL_COMMUNITY)
Admission: AD | Admit: 2015-01-30 | Discharge: 2015-01-30 | Disposition: A | Discharge status: Home / Self Care | Payer: Medicaid Other | Source: Ambulatory Visit | Attending: Obstetrics & Gynecology | Admitting: Obstetrics & Gynecology

## 2015-01-30 ENCOUNTER — Encounter (HOSPITAL_COMMUNITY): Payer: Self-pay

## 2015-01-30 DIAGNOSIS — M549 Dorsalgia, unspecified: Secondary | ICD-10-CM | POA: Insufficient documentation

## 2015-01-30 DIAGNOSIS — O9989 Other specified diseases and conditions complicating pregnancy, childbirth and the puerperium: Secondary | ICD-10-CM | POA: Diagnosis not present

## 2015-01-30 DIAGNOSIS — M545 Low back pain: Secondary | ICD-10-CM

## 2015-01-30 DIAGNOSIS — Z3A35 35 weeks gestation of pregnancy: Secondary | ICD-10-CM | POA: Diagnosis not present

## 2015-01-30 DIAGNOSIS — O0933 Supervision of pregnancy with insufficient antenatal care, third trimester: Secondary | ICD-10-CM | POA: Insufficient documentation

## 2015-01-30 HISTORY — DX: Headache: R51

## 2015-01-30 HISTORY — DX: Unspecified infection of urinary tract in pregnancy, unspecified trimester: O23.40

## 2015-01-30 HISTORY — DX: Headache, unspecified: R51.9

## 2015-01-30 LAB — OB RESULTS CONSOLE HIV ANTIBODY (ROUTINE TESTING): HIV: NONREACTIVE

## 2015-01-30 LAB — DIFFERENTIAL
BASOS ABS: 0 10*3/uL (ref 0.0–0.1)
Basophils Relative: 0 % (ref 0–1)
Eosinophils Absolute: 0.2 10*3/uL (ref 0.0–0.7)
Eosinophils Relative: 1 % (ref 0–5)
Lymphocytes Relative: 31 % (ref 12–46)
Lymphs Abs: 4.7 10*3/uL — ABNORMAL HIGH (ref 0.7–4.0)
Monocytes Absolute: 1.1 10*3/uL — ABNORMAL HIGH (ref 0.1–1.0)
Monocytes Relative: 8 % (ref 3–12)
Neutro Abs: 9.1 10*3/uL — ABNORMAL HIGH (ref 1.7–7.7)
Neutrophils Relative %: 60 % (ref 43–77)

## 2015-01-30 LAB — WET PREP, GENITAL: TRICH WET PREP: NONE SEEN

## 2015-01-30 LAB — RAPID URINE DRUG SCREEN, HOSP PERFORMED
AMPHETAMINES: NOT DETECTED
BENZODIAZEPINES: NOT DETECTED
Barbiturates: NOT DETECTED
COCAINE: NOT DETECTED
Opiates: NOT DETECTED
Tetrahydrocannabinol: NOT DETECTED

## 2015-01-30 LAB — HIV ANTIBODY (ROUTINE TESTING W REFLEX): HIV Screen 4th Generation wRfx: NONREACTIVE

## 2015-01-30 LAB — URINALYSIS, ROUTINE W REFLEX MICROSCOPIC
BILIRUBIN URINE: NEGATIVE
Glucose, UA: NEGATIVE mg/dL
Hgb urine dipstick: NEGATIVE
Ketones, ur: NEGATIVE mg/dL
Leukocytes, UA: NEGATIVE
NITRITE: NEGATIVE
PH: 6 (ref 5.0–8.0)
Protein, ur: NEGATIVE mg/dL
SPECIFIC GRAVITY, URINE: 1.025 (ref 1.005–1.030)
Urobilinogen, UA: 0.2 mg/dL (ref 0.0–1.0)

## 2015-01-30 LAB — GC/CHLAMYDIA PROBE AMP (~~LOC~~) NOT AT ARMC
Chlamydia: NEGATIVE
Neisseria Gonorrhea: NEGATIVE

## 2015-01-30 LAB — CBC
HCT: 31.1 % — ABNORMAL LOW (ref 36.0–46.0)
Hemoglobin: 10.8 g/dL — ABNORMAL LOW (ref 12.0–15.0)
MCH: 32.8 pg (ref 26.0–34.0)
MCHC: 34.7 g/dL (ref 30.0–36.0)
MCV: 94.5 fL (ref 78.0–100.0)
Platelets: 166 10*3/uL (ref 150–400)
RBC: 3.29 MIL/uL — ABNORMAL LOW (ref 3.87–5.11)
RDW: 12.9 % (ref 11.5–15.5)
WBC: 15.1 10*3/uL — ABNORMAL HIGH (ref 4.0–10.5)

## 2015-01-30 LAB — OB RESULTS CONSOLE GC/CHLAMYDIA: Gonorrhea: NEGATIVE

## 2015-01-30 LAB — GROUP B STREP BY PCR: GROUP B STREP BY PCR: NEGATIVE

## 2015-01-30 LAB — TYPE AND SCREEN
ABO/RH(D): O POS
Antibody Screen: NEGATIVE

## 2015-01-30 LAB — RPR: RPR Ser Ql: NONREACTIVE

## 2015-01-30 NOTE — MAU Provider Note (Signed)
History   CSN: [id number]  Arrival date and time: 01/30/15 0145    Chief Complaint  Patient presents with  . Pelvic Pain  . Back Pain  . Vaginal Discharge    HPI  Patient is 24 y.o. [id number] [redacted]w[redacted]d here with complaints of back pain for the last 1.5 weeks. Pain is intermittent and described as sharp. She also will get pelvic and back pain. She endorses recent pink tinted vaginal discharge. She says it is hard for her to get up and down because of the back pain. She states she has had back labor with two of her pregnancies.  +FM, denies LOF, VB, contractions   OB History    Gravida Para Term Preterm AB TAB SAB Ectopic Multiple Living      -all delivered through c-section -Baby has one functioning kidney other not formed -was going to [facility] and unable to go back due to finances (last seen April) - told [facility] because all kids were early  -late prenatal care  Past Medical History  Diagnosis Date  . Anxiety   . Asthma   . Chlamydia   . Enlarged thyroid   . Headache   . UTI (urinary tract infection) during pregnancy     Past Surgical History  Procedure Laterality Date  . Cesarean section  2010  . Tonsillectomy  2009  . Dilation and evacuation  04/01/2011    Procedure: DILATATION AND EVACUATION (D&E);  Surgeon: Almon Hercules;  Location: [facility];  Service: Gynecology;  Laterality: N/A;  . Dilatation and curretage      Family History  Problem Relation Age of Onset  . Depression Mother   . Hyperlipidemia Mother   . Diabetes Mother   . Anesthesia problems Neg Hx     History  Substance Use Topics  . Smoking status: Current Every Day Smoker -- 0.25 packs/day for 3 years    Types: Cigarettes  . Smokeless tobacco: Not on file  . Alcohol Use: Yes    Allergies: No Known Allergies  No prescriptions prior to admission    Review of Systems  Constitutional: Negative for fever and chills.  Eyes: Negative for blurred vision.  Respiratory:  Negative for shortness of breath.   Cardiovascular: Negative for chest pain.  Gastrointestinal: Positive for nausea. Negative for vomiting.  Genitourinary: Negative for dysuria.  Musculoskeletal: Positive for back pain.  Neurological: Positive for headaches. Negative for dizziness.  Also per HPI  Physical Exam  Blood pressure 96/48, pulse 90, temperature 97.8 F (36.6 C), temperature source Oral, resp. rate 18, height 5' 3.5" (1.613 m), weight 150 lb 12.8 oz (68.402 kg), SpO2 100 %.  Physical Exam  Constitutional: She is oriented to person, place, and time. She appears well-developed and well-nourished. No distress.  HENT:  Head: Normocephalic and atraumatic.  Eyes: EOM are normal.  Cardiovascular: Normal rate, regular rhythm, normal heart sounds and intact distal pulses.   Respiratory: Effort normal and breath sounds normal.  GI: Soft. There is no tenderness.  gravid  Genitourinary: Vaginal discharge found.  Musculoskeletal: Normal range of motion. She exhibits no edema.  Mild tenderness to palpation in lower back in multiple areas  Neurological: She is alert and oriented to person, place, and time.  Skin: Skin is warm and dry. No rash noted.  Psychiatric: She has a normal mood and affect.   Dilation: Closed Effacement (%): Thick Cervical Position: Posterior Exam by:: Dr. Doroteo Glassman  Results for orders placed or performed during the hospital encounter of 01/30/15 (from the past 24 hour(s))  Urinalysis, Routine w reflex microscopic (not at [facility])     Status: None   Collection Time: 01/30/15  1:50 AM  Result Value Ref Range   Color, Urine YELLOW YELLOW   APPearance CLEAR CLEAR   Specific Gravity, Urine 1.025 1.005 - 1.030   pH 6.0 5.0 - 8.0   Glucose, UA NEGATIVE NEGATIVE mg/dL   Hgb urine dipstick NEGATIVE NEGATIVE   Bilirubin Urine NEGATIVE NEGATIVE   Ketones, ur NEGATIVE NEGATIVE mg/dL   Protein, ur NEGATIVE NEGATIVE mg/dL   Urobilinogen, UA 0.2 0.0 - 1.0 mg/dL   Nitrite  NEGATIVE NEGATIVE   Leukocytes, UA NEGATIVE NEGATIVE  Wet prep, genital     Status: Abnormal   Collection Time: 01/30/15  3:00 AM  Result Value Ref Range   Yeast Wet Prep HPF POC FEW (A) NONE SEEN   Trich, Wet Prep NONE SEEN NONE SEEN   Clue Cells Wet Prep HPF POC FEW (A) NONE SEEN   WBC, Wet Prep HPF POC MODERATE (A) NONE SEEN  CBC     Status: Abnormal   Collection Time: 01/30/15  3:35 AM  Result Value Ref Range   WBC 15.1 (H) 4.0 - 10.5 K/uL   RBC 3.29 (L) 3.87 - 5.11 MIL/uL   Hemoglobin 10.8 (L) 12.0 - 15.0 g/dL   HCT 96.7 (L) 89.3 - 81.0 %   MCV 94.5 78.0 - 100.0 fL   MCH 32.8 26.0 - 34.0 pg   MCHC 34.7 30.0 - 36.0 g/dL   RDW 17.5 10.2 - 58.5 %   Platelets 166 150 - 400 K/uL  Differential     Status: Abnormal   Collection Time: 01/30/15  3:35 AM  Result Value Ref Range   Neutrophils Relative % 60 43 - 77 %   Neutro Abs 9.1 (H) 1.7 - 7.7 K/uL   Lymphocytes Relative 31 12 - 46 %   Lymphs Abs 4.7 (H) 0.7 - 4.0 K/uL   Monocytes Relative 8 3 - 12 %   Monocytes Absolute 1.1 (H) 0.1 - 1.0 K/uL   Eosinophils Relative 1 0 - 5 %   Eosinophils Absolute 0.2 0.0 - 0.7 K/uL   Basophils Relative 0 0 - 1 %   Basophils Absolute 0.0 0.0 - 0.1 K/uL    MAU Course  Procedures - None  MDM: -NST reactive and reassuring -Speculum exam with normal vaginal discharge or pregnancy -SVE with closed cervix -Initial prenatal labs ordered - blood work, Gc/Ch, GBS, UDS, wet prep, UA  Assessment and Plan  A: Patient is 24 y.o. [id number] [redacted]w[redacted]d reporting intermittent back pain likely secondary to usual back pain of pregnancy. No red flags. No signs of PTL.   P: Discharge home pt stable - Reviewed findings and my conclusion - conservative management given  - fetal kick counts reinforced - preterm labor precautions dicussed - Handout given - Message sent to schedule appointment in clinic for OB care.   Caryl Ada, DO 01/30/2015, 3:57 AM PGY-1, [facility]

## 2015-01-30 NOTE — Discharge Instructions (Signed)
If you do not hear about an OB prenatal appointment in 2-3 days please call clinic number that is listed.    Back Pain in Pregnancy Back pain during pregnancy is common. It happens in about half of all pregnancies. It is important for you and your baby that you remain active during your pregnancy.If you feel that back pain is not allowing you to remain active or sleep well, it is time to see your caregiver. Back pain may be caused by several factors related to changes during your pregnancy.Fortunately, unless you had trouble with your back before your pregnancy, the pain is likely to get better after you deliver. Low back pain usually occurs between the fifth and seventh months of pregnancy. It can, however, happen in the first couple months. Factors that increase the risk of back problems include:   Previous back problems.  Injury to your back.  Having twins or multiple births.  A chronic cough.  Stress.  Job-related repetitive motions.  Muscle or spinal disease in the back.  Family history of back problems, ruptured (herniated) discs, or osteoporosis.  Depression, anxiety, and panic attacks. CAUSES   When you are pregnant, your body produces a hormone called relaxin. This hormonemakes the ligaments connecting the low back and pubic bones more flexible. This flexibility allows the baby to be delivered more easily. When your ligaments are loose, your muscles need to work harder to support your back. Soreness in your back can come from tired muscles. Soreness can also come from back tissues that are irritated since they are receiving less support.  As the baby grows, it puts pressure on the nerves and blood vessels in your pelvis. This can cause back pain.  As the baby grows and gets heavier during pregnancy, the uterus pushes the stomach muscles forward and changes your center of gravity. This makes your back muscles work harder to maintain good posture. SYMPTOMS  Lumbar pain  during pregnancy Lumbar pain during pregnancy usually occurs at or above the waist in the center of the back. There may be pain and numbness that radiates into your leg or foot. This is similar to low back pain experienced by non-pregnant women. It usually increases with sitting for long periods of time, standing, or repetitive lifting. Tenderness may also be present in the muscles along your upper back. Posterior pelvic pain during pregnancy Pain in the back of the pelvis is more common than lumbar pain in pregnancy. It is a deep pain felt in your side at the waistline, or across the tailbone (sacrum), or in both places. You may have pain on one or both sides. This pain can also go into the buttocks and backs of the upper thighs. Pubic and groin pain may also be present. The pain does not quickly resolve with rest, and morning stiffness may also be present. Pelvic pain during pregnancy can be brought on by most activities. A high level of fitness before and during pregnancy may or may not prevent this problem. Labor pain is usually 1 to 2 minutes apart, lasts for about 1 minute, and involves a bearing down feeling or pressure in your pelvis. However, if you are at term with the pregnancy, constant low back pain can be the beginning of early labor, and you should be aware of this. DIAGNOSIS  X-rays of the back should not be done during the first 12 to 14 weeks of the pregnancy and only when absolutely necessary during the rest of the pregnancy. MRIs do  not give off radiation and are safe during pregnancy. MRIs also should only be done when absolutely necessary. HOME CARE INSTRUCTIONS  Exercise as directed by your caregiver. Exercise is the most effective way to prevent or manage back pain. If you have a back problem, it is especially important to avoid sports that require sudden body movements. Swimming and walking are great activities.  Do not stand in one place for long periods of time.  Do not wear  high heels.  Sit in chairs with good posture. Use a pillow on your lower back if necessary. Make sure your head rests over your shoulders and is not hanging forward.  Try sleeping on your side, preferably the left side, with a pillow or two between your legs. If you are sore after a night's rest, your bedmay betoo soft.Try placing a board between your mattress and box spring.  Listen to your body when lifting.If you are experiencing pain, ask for help or try bending yourknees more so you can use your leg muscles rather than your back muscles. Squat down when picking up something from the floor. Do not bend over.  Eat a healthy diet. Try to gain weight within your caregiver's recommendations.  Use heat or cold packs 3 to 4 times a day for 15 minutes to help with the pain.  Only take over-the-counter or prescription medicines for pain, discomfort, or fever as directed by your caregiver. Sudden (acute) back pain  Use bed rest for only the most extreme, acute episodes of back pain. Prolonged bed rest over 48 hours will aggravate your condition.  Ice is very effective for acute conditions.  Put ice in a plastic bag.  Place a towel between your skin and the bag.  Leave the ice on for 10 to 20 minutes every 2 hours, or as needed.  Using heat packs for 30 minutes prior to activities is also helpful. Continued back pain See your caregiver if you have continued problems. Your caregiver can help or refer you for appropriate physical therapy. With conditioning, most back problems can be avoided. Sometimes, a more serious issue may be the cause of back pain. You should be seen right away if new problems seem to be developing. Your caregiver may recommend:  A maternity girdle.  An elastic sling.  A back brace.  A massage therapist or acupuncture. SEEK MEDICAL CARE IF:   You are not able to do most of your daily activities, even when taking the pain medicine you were given.  You need a  referral to a physical therapist or chiropractor.  You want to try acupuncture. SEEK IMMEDIATE MEDICAL CARE IF:  You develop numbness, tingling, weakness, or problems with the use of your arms or legs.  You develop severe back pain that is no longer relieved with medicines.  You have a sudden change in bowel or bladder control.  You have increasing pain in other areas of the body.  You develop shortness of breath, dizziness, or fainting.  You develop nausea, vomiting, or sweating.  You have back pain which is similar to labor pains.  You have back pain along with your water breaking or vaginal bleeding.  You have back pain or numbness that travels down your leg.  Your back pain developed after you fell.  You develop pain on one side of your back. You may have a kidney stone.  You see blood in your urine. You may have a bladder infection or kidney stone.  You have back  pain with blisters. You may have shingles. Back pain is fairly common during pregnancy but should not be accepted as just part of the process. Back pain should always be treated as soon as possible. This will make your pregnancy as pleasant as possible. Document Released: 10/30/2005 Document Revised: 10/14/2011 Document Reviewed: 12/11/2010 [facility] Patient Information 2015 [place], [place]. This information is not intended to replace advice given to you by your health care provider. Make sure you discuss any questions you have with your health care provider.

## 2015-01-30 NOTE — MAU Note (Signed)
Constant pelvic/low back pain x 1 week. Denies vaginal bleeding. Some clear/pink tinged mucous like vaginal discharge. Denies vaginal bleeding. Positive fetal movement. Started care in [place] but couldn't go back d/t financial issues; has not been to OB in several months.

## 2015-01-31 LAB — RUBELLA SCREEN: Rubella: 6.86 index (ref >0.99)

## 2015-02-01 ENCOUNTER — Inpatient Hospital Stay (HOSPITAL_COMMUNITY)
Admission: AD | Admit: 2015-02-01 | Discharge: 2015-02-02 | Disposition: A | Discharge status: Home / Self Care | Payer: Medicaid Other | Source: Ambulatory Visit | Attending: Obstetrics & Gynecology | Admitting: Obstetrics & Gynecology

## 2015-02-01 ENCOUNTER — Encounter (HOSPITAL_COMMUNITY): Payer: Self-pay

## 2015-02-01 DIAGNOSIS — Z3A36 36 weeks gestation of pregnancy: Secondary | ICD-10-CM | POA: Insufficient documentation

## 2015-02-01 NOTE — MAU Provider Note (Signed)
History   CSN: [id number]  Arrival date and time: 02/01/15 2249   First Provider Initiated Contact with Patient 02/01/15 2346     No chief complaint on file.  HPI Patient is 24 y.o. [id number] 2560w0d here with complaints of contractions that started last night. She says they have become more painful and unbearable. Contractions were every 5 min last night but have spaced out. Pt states she was crying and doubled over from the pain.   +FM, denies LOF, VB,  vaginal discharge.  OB History    Gravida Para Term Preterm AB TAB SAB Ectopic Multiple Living   6 3 0 3 2 0 2 0 0 3     -No prenatal care (appt scheduled for 7/14)  Past Medical History  Diagnosis Date  . Anxiety   . Asthma   . Chlamydia   . Enlarged thyroid   . Headache   . UTI (urinary tract infection) during pregnancy     Past Surgical History  Procedure Laterality Date  . Cesarean section  2010  . Tonsillectomy  2009  . Dilation and evacuation  04/01/2011    Procedure: DILATATION AND EVACUATION (D&E);  Surgeon: Almon HerculesKendra H. Ross;  Location: [facility];  Service: Gynecology;  Laterality: N/A;  . Dilatation and curretage    . Tympanostomy tube placement      Family History  Problem Relation Age of Onset  . Depression Mother   . Hyperlipidemia Mother   . Diabetes Mother   . Anesthesia problems Neg Hx     History  Substance Use Topics  . Smoking status: Current Every Day Smoker -- 0.25 packs/day for 3 years    Types: Cigarettes  . Smokeless tobacco: Not on file  . Alcohol Use: Yes    Allergies: No Known Allergies  Prescriptions prior to admission  Medication Sig Dispense Refill Last Dose  . acetaminophen (TYLENOL) 500 MG tablet Take 500 mg by mouth every 6 (six) hours as needed.   02/01/2015 at Unknown time  . prenatal vitamin w/FE, FA (PRENATAL 1 + 1) 27-1 MG TABS tablet Take 1 tablet by mouth daily at 12 noon.   01/31/2015 at Unknown time  . albuterol (PROVENTIL HFA;VENTOLIN HFA) 108 (90 BASE) MCG/ACT inhaler Inhale  1-2 puffs into the lungs every 6 (six) hours as needed for wheezing. 1 Inhaler 0 More than a month at Unknown time    Review of Systems  Constitutional: Negative for fever and chills.  Eyes: Positive for blurred vision.  Respiratory: Negative for shortness of breath.   Cardiovascular: Negative for chest pain and leg swelling.  Gastrointestinal: Positive for abdominal pain. Negative for nausea, vomiting and constipation.  Neurological: Positive for dizziness. Negative for headaches.  Also per HPI  Physical Exam   Blood pressure 122/68, pulse 93, temperature 98 F (36.7 C), temperature source Oral, resp. rate 16, height 5' 3.5" (1.613 m), weight 154 lb (69.854 kg), SpO2 98 %.  Physical Exam  Constitutional: She is oriented to person, place, and time. She appears well-developed and well-nourished. No distress.  HENT:  Head: Normocephalic and atraumatic.  Eyes: EOM are normal.  Cardiovascular: Normal rate and intact distal pulses.   Respiratory: Effort normal and breath sounds normal.  GI: Soft. There is no tenderness.  gravid  Musculoskeletal: Normal range of motion.  Neurological: She is alert and oriented to person, place, and time.  Skin: Skin is warm and dry.  Psychiatric: She has a normal mood and affect.   Dilation: 1 Effacement (%):  Thick Cervical Position: Posterior Exam by:: D Simpson RN  No results found for this or any previous visit (from the past 24 hour(s)).   MAU Course  Procedures - None  MDM: -R/o labor eval -NST reactive and reassuring -Toco with intermittent irregular contractions but more irratibility  Assessment and Plan   Switched to RN labor check Pt stable for discharge with follow-up at scheduled initial OB appointment.   Caryl Ada, DO 02/01/2015, 11:59 PM PGY-1, [facility]   OB fellow attestation:  I have seen and examined this patient; I agree with above documentation in the resident's note.   Adelie MALYNA BUDNEY is  a 24 y.o. [phone] reporting preterm contractions  PE: BP 91/48 mmHg  Pulse 89  Temp(Src) 98 F (36.7 C) (Oral)  Resp 16  Ht 5' 3.5" (1.613 m)  Wt 154 lb (69.854 kg)  BMI 26.85 kg/m2  SpO2 98%  Dilation: 1 Effacement (%): Thick Cervical Position: Posterior Exam by:: D Simpson RN  NST reactive  Plan: - no cervical change, message sent to clinic to set up initial prenatal visit  Perry Mount, MD 4:18 PM

## 2015-02-01 NOTE — MAU Note (Signed)
Pt reports she has been having contractions x 24 hours, worsening tonight. Denies bleeding.

## 2015-02-02 DIAGNOSIS — Z3A36 36 weeks gestation of pregnancy: Secondary | ICD-10-CM | POA: Diagnosis not present

## 2015-02-02 LAB — HEPATITIS B SURFACE ANTIGEN: Hepatitis B Surface Ag: NEGATIVE

## 2015-02-02 NOTE — Discharge Instructions (Signed)
Braxton [name] Contractions °Contractions of the uterus can occur throughout pregnancy. Contractions are not always a sign that you are in labor.  °WHAT ARE BRAXTON [name] CONTRACTIONS?  °Contractions that occur before labor are called Braxton [name] contractions, or false labor. Toward the end of pregnancy (32-34 weeks), these contractions can develop more often and may become more forceful. This is not true labor because these contractions do not result in opening (dilatation) and thinning of the cervix. They are sometimes difficult to tell apart from true labor because these contractions can be forceful and people have different pain tolerances. You should not feel embarrassed if you go to the hospital with false labor. Sometimes, the only way to tell if you are in true labor is for your health care provider to look for changes in the cervix. °If there are no prenatal problems or other health problems associated with the pregnancy, it is completely safe to be sent home with false labor and await the onset of true labor. °HOW CAN YOU TELL THE DIFFERENCE BETWEEN TRUE AND FALSE LABOR? °False Labor °· The contractions of false labor are usually shorter and not as hard as those of true labor.   °· The contractions are usually irregular.   °· The contractions are often felt in the front of the lower abdomen and in the groin.   °· The contractions may go away when you walk around or change positions while lying down.   °· The contractions get weaker and are shorter lasting as time goes on.   °· The contractions do not usually become progressively stronger, regular, and closer together as with true labor.   °True Labor °· Contractions in true labor last 30-70 seconds, become very regular, usually become more intense, and increase in frequency.   °· The contractions do not go away with walking.   °· The discomfort is usually felt in the top of the uterus and spreads to the lower abdomen and low back.   °· True labor can be  determined by your health care provider with an exam. This will show that the cervix is dilating and getting thinner.   °WHAT TO REMEMBER °· Keep up with your usual exercises and follow other instructions given by your health care provider.   °· Take medicines as directed by your health care provider.   °· Keep your regular prenatal appointments.   °· Eat and drink lightly if you think you are going into labor.   °· If Braxton [name] contractions are making you uncomfortable:   °¨ Change your position from lying down or resting to walking, or from walking to resting.   °¨ Sit and rest in a tub of warm water.   °¨ Drink 2-3 glasses of water. Dehydration may cause these contractions.   °¨ Do slow and deep breathing several times an hour.   °WHEN SHOULD I SEEK IMMEDIATE MEDICAL CARE? °Seek immediate medical care if: °· Your contractions become stronger, more regular, and closer together.   °· You have fluid leaking or gushing from your vagina.   °· You have a fever.   °· You pass blood-tinged mucus.   °· You have vaginal bleeding.   °· You have continuous abdominal pain.   °· You have low back pain that you never had before.   °· You feel your baby's head pushing down and causing pelvic pressure.   °· Your baby is not moving as much as it used to.   °Document Released: 07/22/2005 Document Revised: 07/27/2013 Document Reviewed: 05/03/2013 °Exit[employer]® Patient Information ©2015 [employer]. This information is not intended to replace advice given to you by your health care   provider. Make sure you discuss any questions you have with your health care provider. ° °

## 2015-02-16 ENCOUNTER — Other Ambulatory Visit: Payer: Self-pay | Admitting: Obstetrics and Gynecology

## 2015-02-16 ENCOUNTER — Ambulatory Visit (INDEPENDENT_AMBULATORY_CARE_PROVIDER_SITE_OTHER): Payer: BLUE CROSS/BLUE SHIELD | Admitting: Obstetrics & Gynecology

## 2015-02-16 ENCOUNTER — Encounter: Payer: Self-pay | Admitting: Obstetrics & Gynecology

## 2015-02-16 VITALS — BP 114/66 | Wt 153.0 lb

## 2015-02-16 DIAGNOSIS — Z3493 Encounter for supervision of normal pregnancy, unspecified, third trimester: Secondary | ICD-10-CM | POA: Diagnosis not present

## 2015-02-16 DIAGNOSIS — Z3490 Encounter for supervision of normal pregnancy, unspecified, unspecified trimester: Secondary | ICD-10-CM | POA: Insufficient documentation

## 2015-02-16 DIAGNOSIS — O3421 Maternal care for scar from previous cesarean delivery: Secondary | ICD-10-CM | POA: Diagnosis not present

## 2015-02-16 LAB — POCT URINALYSIS DIP (DEVICE)
Bilirubin Urine: NEGATIVE
Glucose, UA: NEGATIVE mg/dL
Hgb urine dipstick: NEGATIVE
Ketones, ur: NEGATIVE mg/dL
LEUKOCYTES UA: NEGATIVE
NITRITE: NEGATIVE
PROTEIN: NEGATIVE mg/dL
Specific Gravity, Urine: 1.025 (ref 1.005–1.030)
UROBILINOGEN UA: 0.2 mg/dL (ref 0.0–1.0)
pH: 6 (ref 5.0–8.0)

## 2015-02-16 NOTE — Progress Notes (Signed)
Pain/pressure- back, lower abd, legs Edema- hands/feet  New ob/28wk packet given  1 hr Glucola given

## 2015-02-16 NOTE — Progress Notes (Signed)
Nutrition note: 1st visit consult Pt has gained 29# @ 2127w1d, which is wnl. Pt's iron was slightly low on 01/30/15 & pt reports they told her she was anemic after her last pregnancy. Pt reports eating 3 meals & 4-5 snacks/d. Pt is taking a PNV. Pt reports no N&V but has some heartburn occ. Pt received verbal & written education on general nutrition during pregnancy. Discussed iron rich foods. Discussed importance of exclusive BF. Discussed wt gain goals of 25-35# or 1#/wk. Pt agrees to continue taking a PNV. Pt does not have WIC but [place]. Pt plans to BF. F/u as needed Blondell RevealLaura Legion Discher, MS, RD, LDN, [facility]

## 2015-02-16 NOTE — Patient Instructions (Signed)

## 2015-02-17 ENCOUNTER — Encounter: Payer: Self-pay | Admitting: *Deleted

## 2015-02-17 LAB — GLUCOSE TOLERANCE, 1 HOUR (50G) W/O FASTING: Glucose, 1 Hour GTT: 97 mg/dL (ref 70–140)

## 2015-02-19 LAB — CULTURE, OB URINE

## 2015-02-21 ENCOUNTER — Telehealth: Payer: Self-pay | Admitting: General Practice

## 2015-02-21 ENCOUNTER — Encounter (HOSPITAL_COMMUNITY)
Admission: RE | Admit: 2015-02-21 | Discharge: 2015-02-21 | Disposition: A | Discharge status: Home / Self Care | Payer: BLUE CROSS/BLUE SHIELD | Source: Ambulatory Visit | Attending: Obstetrics and Gynecology | Admitting: Obstetrics and Gynecology

## 2015-02-21 ENCOUNTER — Encounter (HOSPITAL_COMMUNITY): Payer: Self-pay

## 2015-02-21 DIAGNOSIS — N390 Urinary tract infection, site not specified: Secondary | ICD-10-CM

## 2015-02-21 HISTORY — DX: Anemia, unspecified: D64.9

## 2015-02-21 LAB — CBC
HCT: 34.5 % — ABNORMAL LOW (ref 36.0–46.0)
Hemoglobin: 11.6 g/dL — ABNORMAL LOW (ref 12.0–15.0)
MCH: 32.2 pg (ref 26.0–34.0)
MCHC: 33.6 g/dL (ref 30.0–36.0)
MCV: 95.8 fL (ref 78.0–100.0)
Platelets: 179 10*3/uL (ref 150–400)
RBC: 3.6 MIL/uL — ABNORMAL LOW (ref 3.87–5.11)
RDW: 13.3 % (ref 11.5–15.5)
WBC: 11.2 10*3/uL — AB (ref 4.0–10.5)

## 2015-02-21 NOTE — Telephone Encounter (Signed)
Called patient for UTI results and antibiotic sent to pharmacy. No answer- left message stating we are trying to reach you with results, nothing urgent but please call us back at the clinics

## 2015-02-21 NOTE — Patient Instructions (Signed)
Your procedure is scheduled on:  February 22, 2015  Enter through the Main Entrance of [facility] at:  8:30 am   Pick up the phone at the desk and dial [phone].  Call this number if you have problems the morning of surgery: [phone].  Remember: Do NOT eat food: after midnight tonight  Do NOT drink clear liquids after: midnight tonight  Take these medicines the morning of surgery with a SIP OF WATER:  None Bring your inhaler with you day of surgery   Do NOT wear jewelry (body piercing), metal hair clips/bobby pins, or nail polish. Do NOT wear lotions, powders, or perfumes.  You may wear deoderant. Do NOT shave for 48 hours prior to surgery. Do NOT bring valuables to the hospital. Leave suitcase in car.  After surgery it may be brought to your room.  For patients admitted to the hospital, checkout time is 11:00 AM the day of discharge.

## 2015-02-21 NOTE — Progress Notes (Signed)
Subjective:limited care, 38 weeks, wants tubal  [name] Mikus is a 24 y.o. [phone] at 3897w1d being seen today for first prenatal care.  Patient reports occasional contractions.  Contractions: Irregular.  Vag. Bleeding: None. Movement: Present. Denies leaking of fluid.  [facility]  The following portions of the patient's history were reviewed and updated as appropriate: allergies, current medications, past family history, past medical history, past social history, past surgical history and problem list.   Objective:  There were no vitals filed for this visit.  Fetal Status: Fetal Heart Rate (bpm): 147   Movement: Present     General:  Alert, oriented and cooperative. Patient is in no acute distress.  Skin: Skin is warm and dry. No rash noted.   Cardiovascular: Normal heart rate noted chest clear  Respiratory: Normal respiratory effort, no problems with respiration noted  Abdomen: Soft, gravid, appropriate for gestational age. Pain/Pressure: Present     Vaginal: Vag. Bleeding: None.    Vag D/C Character: White  Cervix: Exam revealed      40 % 1 cm  Extremities: Normal range of motion.  Edema: Trace  Mental Status: Normal mood and affect. Normal behavior. Normal judgment and thought content.   Urinalysis:      Assessment and Plan:  Pregnancy: [id number] at 9297w1d  3 previous CS, wants BTL at repeatThe procedure and the risk of anesthesia, bleeding, infection, bowel and bladder injury, failure (1/200) and ectopic pregnancy were discussed and her questions were answered.    Term labor symptoms and general obstetric precautions including but not limited to vaginal bleeding, contractions, leaking of fluid and fetal movement were reviewed in detail with the patient.  Please refer to After Visit Summary for other counseling recommendations.   Schedule repeat CS and BTL 39 weeks on 02/22/15  Adam PhenixJames G Arnold, MD  02/16/2015

## 2015-02-22 ENCOUNTER — Inpatient Hospital Stay (HOSPITAL_COMMUNITY): Payer: BLUE CROSS/BLUE SHIELD | Admitting: Anesthesiology

## 2015-02-22 ENCOUNTER — Encounter (HOSPITAL_COMMUNITY): Payer: Self-pay | Admitting: *Deleted

## 2015-02-22 ENCOUNTER — Inpatient Hospital Stay (HOSPITAL_COMMUNITY)
Admission: RE | Admit: 2015-02-22 | Discharge: 2015-02-25 | DRG: 766 | Disposition: A | Discharge status: Home / Self Care | Payer: BLUE CROSS/BLUE SHIELD | Source: Ambulatory Visit | Attending: Obstetrics and Gynecology | Admitting: Obstetrics and Gynecology

## 2015-02-22 ENCOUNTER — Encounter (HOSPITAL_COMMUNITY)
Admission: RE | Disposition: A | Discharge status: Home / Self Care | Payer: Self-pay | Source: Ambulatory Visit | Attending: Obstetrics and Gynecology

## 2015-02-22 DIAGNOSIS — O3421 Maternal care for scar from previous cesarean delivery: Principal | ICD-10-CM | POA: Diagnosis present

## 2015-02-22 DIAGNOSIS — F17210 Nicotine dependence, cigarettes, uncomplicated: Secondary | ICD-10-CM | POA: Diagnosis present

## 2015-02-22 DIAGNOSIS — Z3A39 39 weeks gestation of pregnancy: Secondary | ICD-10-CM | POA: Diagnosis present

## 2015-02-22 DIAGNOSIS — Z98891 History of uterine scar from previous surgery: Secondary | ICD-10-CM

## 2015-02-22 DIAGNOSIS — O99334 Smoking (tobacco) complicating childbirth: Secondary | ICD-10-CM | POA: Diagnosis present

## 2015-02-22 DIAGNOSIS — K5900 Constipation, unspecified: Secondary | ICD-10-CM | POA: Diagnosis not present

## 2015-02-22 DIAGNOSIS — R079 Chest pain, unspecified: Secondary | ICD-10-CM | POA: Diagnosis not present

## 2015-02-22 DIAGNOSIS — Z3490 Encounter for supervision of normal pregnancy, unspecified, unspecified trimester: Secondary | ICD-10-CM

## 2015-02-22 DIAGNOSIS — O358XX0 Maternal care for other (suspected) fetal abnormality and damage, not applicable or unspecified: Secondary | ICD-10-CM | POA: Diagnosis present

## 2015-02-22 DIAGNOSIS — D649 Anemia, unspecified: Secondary | ICD-10-CM | POA: Diagnosis present

## 2015-02-22 DIAGNOSIS — O9902 Anemia complicating childbirth: Secondary | ICD-10-CM | POA: Diagnosis present

## 2015-02-22 DIAGNOSIS — Z833 Family history of diabetes mellitus: Secondary | ICD-10-CM | POA: Diagnosis not present

## 2015-02-22 DIAGNOSIS — R0689 Other abnormalities of breathing: Secondary | ICD-10-CM

## 2015-02-22 DIAGNOSIS — Z302 Encounter for sterilization: Secondary | ICD-10-CM

## 2015-02-22 LAB — RPR: RPR Ser Ql: NONREACTIVE

## 2015-02-22 SURGERY — Surgical Case
Anesthesia: Spinal | Site: Abdomen | Laterality: Bilateral

## 2015-02-22 MED ORDER — NALBUPHINE HCL 10 MG/ML IJ SOLN: 5.0000 mg | INTRAMUSCULAR | Status: DC | PRN

## 2015-02-22 MED ORDER — LACTATED RINGERS IV SOLN
INTRAVENOUS | Status: DC
  Administered 2015-02-22: 125 mL/h via INTRAVENOUS
  Administered 2015-02-23: 03:00:00 via INTRAVENOUS

## 2015-02-22 MED ORDER — SIMETHICONE 80 MG PO CHEW
80.0000 mg | CHEWABLE_TABLET | Freq: Three times a day (TID) | ORAL | Status: DC
  Administered 2015-02-22 – 2015-02-25 (×9): 80 mg via ORAL
  Filled 2015-02-22 (×9): qty 1

## 2015-02-22 MED ORDER — SIMETHICONE 80 MG PO CHEW
80.0000 mg | CHEWABLE_TABLET | ORAL | Status: DC
  Administered 2015-02-24 – 2015-02-25 (×2): 80 mg via ORAL
  Filled 2015-02-22 (×3): qty 1

## 2015-02-22 MED ORDER — ACETAMINOPHEN 160 MG/5ML PO SOLN
ORAL | Status: AC
  Filled 2015-02-22: qty 40.6

## 2015-02-22 MED ORDER — FENTANYL CITRATE (PF) 100 MCG/2ML IJ SOLN
25.0000 ug | INTRAMUSCULAR | Status: DC | PRN
  Administered 2015-02-22: 50 ug via INTRAVENOUS
  Administered 2015-02-22: 25 ug via INTRAVENOUS
  Administered 2015-02-22: 50 ug via INTRAVENOUS
  Administered 2015-02-22: 25 ug via INTRAVENOUS

## 2015-02-22 MED ORDER — DIPHENHYDRAMINE HCL 25 MG PO CAPS: 25.0000 mg | ORAL_CAPSULE | Freq: Four times a day (QID) | ORAL | Status: DC | PRN

## 2015-02-22 MED ORDER — TETANUS-DIPHTH-ACELL PERTUSSIS 5-2.5-18.5 LF-MCG/0.5 IM SUSP
0.5000 mL | Freq: Once | INTRAMUSCULAR | Status: AC
  Administered 2015-02-25: 0.5 mL via INTRAMUSCULAR

## 2015-02-22 MED ORDER — MORPHINE SULFATE 0.5 MG/ML IJ SOLN
INTRAMUSCULAR | Status: AC
  Filled 2015-02-22: qty 10

## 2015-02-22 MED ORDER — NALOXONE HCL 1 MG/ML IJ SOLN: 1.0000 ug/kg/h | INTRAVENOUS | Status: DC | PRN

## 2015-02-22 MED ORDER — SENNOSIDES-DOCUSATE SODIUM 8.6-50 MG PO TABS
2.0000 | ORAL_TABLET | ORAL | Status: DC
  Administered 2015-02-23 – 2015-02-25 (×3): 2 via ORAL
  Filled 2015-02-22 (×4): qty 2

## 2015-02-22 MED ORDER — PNEUMOCOCCAL VAC POLYVALENT 25 MCG/0.5ML IJ INJ
0.5000 mL | INJECTION | INTRAMUSCULAR | Status: AC
  Administered 2015-02-25: 0.5 mL via INTRAMUSCULAR
  Filled 2015-02-22 (×2): qty 0.5

## 2015-02-22 MED ORDER — DIBUCAINE 1 % RE OINT: 1.0000 "application " | TOPICAL_OINTMENT | RECTAL | Status: DC | PRN

## 2015-02-22 MED ORDER — CEFAZOLIN SODIUM-DEXTROSE 2-3 GM-% IV SOLR
2.0000 g | INTRAVENOUS | Status: AC
  Administered 2015-02-22: 2 g via INTRAVENOUS

## 2015-02-22 MED ORDER — ACETAMINOPHEN 160 MG/5ML PO SOLN
975.0000 mg | Freq: Once | ORAL | Status: AC
  Administered 2015-02-22: 975 mg via ORAL

## 2015-02-22 MED ORDER — DIPHENHYDRAMINE HCL 50 MG/ML IJ SOLN: 12.5000 mg | INTRAMUSCULAR | Status: DC | PRN

## 2015-02-22 MED ORDER — CHLOROPROCAINE HCL 3 % IJ SOLN
INTRAMUSCULAR | Status: DC | PRN
  Administered 2015-02-22: 20 mL

## 2015-02-22 MED ORDER — SODIUM BICARBONATE 8.4 % IV SOLN
INTRAVENOUS | Status: DC | PRN
  Administered 2015-02-22: 3 mL via EPIDURAL

## 2015-02-22 MED ORDER — FENTANYL CITRATE (PF) 100 MCG/2ML IJ SOLN
INTRAMUSCULAR | Status: AC
  Filled 2015-02-22: qty 2

## 2015-02-22 MED ORDER — SIMETHICONE 80 MG PO CHEW
80.0000 mg | CHEWABLE_TABLET | ORAL | Status: DC | PRN
Start: 2015-02-22 — End: 2015-02-25

## 2015-02-22 MED ORDER — ONDANSETRON HCL 4 MG/2ML IJ SOLN
INTRAMUSCULAR | Status: DC | PRN
  Administered 2015-02-22: 4 mg via INTRAVENOUS

## 2015-02-22 MED ORDER — NALBUPHINE HCL 10 MG/ML IJ SOLN: 5.0000 mg | Freq: Once | INTRAMUSCULAR | Status: AC | PRN

## 2015-02-22 MED ORDER — OXYCODONE-ACETAMINOPHEN 5-325 MG PO TABS
2.0000 | ORAL_TABLET | ORAL | Status: DC | PRN
  Administered 2015-02-24 – 2015-02-25 (×6): 2 via ORAL
  Filled 2015-02-22 (×8): qty 2

## 2015-02-22 MED ORDER — SCOPOLAMINE 1 MG/3DAYS TD PT72
1.0000 | MEDICATED_PATCH | Freq: Once | TRANSDERMAL | Status: DC
Start: 2015-02-22 — End: 2015-02-25
  Filled 2015-02-22: qty 1

## 2015-02-22 MED ORDER — LACTATED RINGERS IV SOLN
INTRAVENOUS | Status: DC
  Administered 2015-02-22 (×2): via INTRAVENOUS

## 2015-02-22 MED ORDER — CEFAZOLIN SODIUM-DEXTROSE 2-3 GM-% IV SOLR
INTRAVENOUS | Status: AC
  Filled 2015-02-22: qty 50

## 2015-02-22 MED ORDER — ONDANSETRON HCL 4 MG/2ML IJ SOLN
INTRAMUSCULAR | Status: AC
  Filled 2015-02-22: qty 2

## 2015-02-22 MED ORDER — SCOPOLAMINE 1 MG/3DAYS TD PT72
1.0000 | MEDICATED_PATCH | Freq: Once | TRANSDERMAL | Status: DC
  Administered 2015-02-22: 1.5 mg via TRANSDERMAL

## 2015-02-22 MED ORDER — ONDANSETRON HCL 4 MG/2ML IJ SOLN
4.0000 mg | Freq: Three times a day (TID) | INTRAMUSCULAR | Status: DC | PRN
Start: 2015-02-22 — End: 2015-02-25
  Administered 2015-02-24: 4 mg via INTRAVENOUS
  Filled 2015-02-22: qty 2

## 2015-02-22 MED ORDER — ACETAMINOPHEN 500 MG PO TABS
1000.0000 mg | ORAL_TABLET | Freq: Four times a day (QID) | ORAL | Status: AC
  Filled 2015-02-22: qty 2

## 2015-02-22 MED ORDER — ACETAMINOPHEN 325 MG PO TABS: 650.0000 mg | ORAL_TABLET | ORAL | Status: DC | PRN

## 2015-02-22 MED ORDER — SCOPOLAMINE 1 MG/3DAYS TD PT72
MEDICATED_PATCH | TRANSDERMAL | Status: AC
Start: 2015-02-22 — End: 2015-02-25
  Administered 2015-02-22: 1.5 mg via TRANSDERMAL
  Filled 2015-02-22: qty 1

## 2015-02-22 MED ORDER — NALBUPHINE HCL 10 MG/ML IJ SOLN
5.0000 mg | INTRAMUSCULAR | Status: DC | PRN
  Administered 2015-02-23: 5 mg via INTRAVENOUS
  Filled 2015-02-22: qty 1

## 2015-02-22 MED ORDER — LACTATED RINGERS IV SOLN
INTRAVENOUS | Status: DC | PRN
  Administered 2015-02-22: 10:00:00 via INTRAVENOUS

## 2015-02-22 MED ORDER — LANOLIN HYDROUS EX OINT: 1.0000 "application " | TOPICAL_OINTMENT | CUTANEOUS | Status: DC | PRN

## 2015-02-22 MED ORDER — OXYTOCIN 40 UNITS IN LACTATED RINGERS INFUSION - SIMPLE MED: 62.5000 mL/h | INTRAVENOUS | Status: AC

## 2015-02-22 MED ORDER — DIPHENHYDRAMINE HCL 25 MG PO CAPS: 25.0000 mg | ORAL_CAPSULE | ORAL | Status: DC | PRN

## 2015-02-22 MED ORDER — KETOROLAC TROMETHAMINE 30 MG/ML IJ SOLN
INTRAMUSCULAR | Status: AC
  Administered 2015-02-22: 30 mg
  Filled 2015-02-22: qty 1

## 2015-02-22 MED ORDER — ALBUTEROL SULFATE (2.5 MG/3ML) 0.083% IN NEBU
3.0000 mL | INHALATION_SOLUTION | Freq: Four times a day (QID) | RESPIRATORY_TRACT | Status: DC | PRN
  Administered 2015-02-25: 3 mL via RESPIRATORY_TRACT
  Filled 2015-02-22: qty 3

## 2015-02-22 MED ORDER — FENTANYL CITRATE (PF) 100 MCG/2ML IJ SOLN
INTRAMUSCULAR | Status: AC
  Administered 2015-02-22: 25 ug via INTRAVENOUS
  Filled 2015-02-22: qty 2

## 2015-02-22 MED ORDER — OXYCODONE-ACETAMINOPHEN 5-325 MG PO TABS
1.0000 | ORAL_TABLET | ORAL | Status: DC | PRN
  Administered 2015-02-23 (×5): 1 via ORAL
  Filled 2015-02-22 (×5): qty 1

## 2015-02-22 MED ORDER — BUPIVACAINE IN DEXTROSE 0.75-8.25 % IT SOLN
INTRATHECAL | Status: DC | PRN
  Administered 2015-02-22: 1.3 mL via INTRATHECAL

## 2015-02-22 MED ORDER — PRENATAL MULTIVITAMIN CH
1.0000 | ORAL_TABLET | Freq: Every day | ORAL | Status: DC
  Administered 2015-02-23 – 2015-02-25 (×3): 1 via ORAL
  Filled 2015-02-22 (×3): qty 1

## 2015-02-22 MED ORDER — FENTANYL CITRATE (PF) 100 MCG/2ML IJ SOLN
INTRAMUSCULAR | Status: AC
  Administered 2015-02-22: 50 ug via INTRAVENOUS
  Filled 2015-02-22: qty 2

## 2015-02-22 MED ORDER — SODIUM CHLORIDE 0.9 % IJ SOLN
3.0000 mL | INTRAMUSCULAR | Status: DC | PRN
  Administered 2015-02-23 – 2015-02-24 (×2): 3 mL via INTRAVENOUS
  Filled 2015-02-22 (×2): qty 3

## 2015-02-22 MED ORDER — MENTHOL 3 MG MT LOZG: 1.0000 | LOZENGE | OROMUCOSAL | Status: DC | PRN

## 2015-02-22 MED ORDER — MORPHINE SULFATE (PF) 0.5 MG/ML IJ SOLN
INTRAMUSCULAR | Status: DC | PRN
  Administered 2015-02-22: .1 mg via INTRATHECAL

## 2015-02-22 MED ORDER — MEPERIDINE HCL 25 MG/ML IJ SOLN
6.2500 mg | INTRAMUSCULAR | Status: DC | PRN
Start: 2015-02-22 — End: 2015-02-22

## 2015-02-22 MED ORDER — PHENYLEPHRINE 8 MG IN D5W 100 ML (0.08MG/ML) PREMIX OPTIME
INJECTION | INTRAVENOUS | Status: AC
  Filled 2015-02-22: qty 100

## 2015-02-22 MED ORDER — OXYTOCIN 10 UNIT/ML IJ SOLN
40.0000 [IU] | INTRAVENOUS | Status: DC | PRN
  Administered 2015-02-22: 40 [IU] via INTRAVENOUS

## 2015-02-22 MED ORDER — OXYTOCIN 10 UNIT/ML IJ SOLN
INTRAMUSCULAR | Status: AC
  Filled 2015-02-22: qty 4

## 2015-02-22 MED ORDER — WITCH HAZEL-GLYCERIN EX PADS: 1.0000 "application " | MEDICATED_PAD | CUTANEOUS | Status: DC | PRN

## 2015-02-22 MED ORDER — FENTANYL CITRATE (PF) 100 MCG/2ML IJ SOLN
INTRAMUSCULAR | Status: DC | PRN
  Administered 2015-02-22: 25 ug via INTRAVENOUS
  Administered 2015-02-22: 50 ug via INTRAVENOUS

## 2015-02-22 MED ORDER — IBUPROFEN 600 MG PO TABS
600.0000 mg | ORAL_TABLET | Freq: Four times a day (QID) | ORAL | Status: DC
  Administered 2015-02-22 – 2015-02-25 (×12): 600 mg via ORAL
  Filled 2015-02-22 (×12): qty 1

## 2015-02-22 MED ORDER — PHENYLEPHRINE 8 MG IN D5W 100 ML (0.08MG/ML) PREMIX OPTIME
INJECTION | INTRAVENOUS | Status: DC | PRN
  Administered 2015-02-22: 60 ug/min via INTRAVENOUS

## 2015-02-22 MED ORDER — NALOXONE HCL 0.4 MG/ML IJ SOLN: 0.4000 mg | INTRAMUSCULAR | Status: DC | PRN

## 2015-02-22 SURGICAL SUPPLY — 33 items
BRR ADH 6X5 SEPRAFILM 1 SHT (MISCELLANEOUS)
CLAMP CORD UMBIL (MISCELLANEOUS) IMPLANT
CONTAINER PREFILL 10% NBF 15ML (MISCELLANEOUS) IMPLANT
DRAPE SHEET LG 3/4 BI-LAMINATE (DRAPES) IMPLANT
DRSG OPSITE POSTOP 4X10 (GAUZE/BANDAGES/DRESSINGS) ×3 IMPLANT
DRSG TELFA 3X8 NADH (GAUZE/BANDAGES/DRESSINGS) ×3 IMPLANT
DURAPREP 26ML APPLICATOR (WOUND CARE) ×3 IMPLANT
ELECT REM PT RETURN 9FT ADLT (ELECTROSURGICAL) ×3
ELECTRODE REM PT RTRN 9FT ADLT (ELECTROSURGICAL) ×1 IMPLANT
EXTRACTOR VACUUM M CUP 4 TUBE (SUCTIONS) IMPLANT
EXTRACTOR VACUUM M CUP 4' TUBE (SUCTIONS)
GLOVE BIOGEL PI IND STRL 6.5 (GLOVE) ×1 IMPLANT
GLOVE BIOGEL PI INDICATOR 6.5 (GLOVE) ×2
GLOVE SURG SS PI 6.0 STRL IVOR (GLOVE) ×3 IMPLANT
GOWN STRL REUS W/TWL LRG LVL3 (GOWN DISPOSABLE) ×6 IMPLANT
KIT ABG SYR 3ML LUER SLIP (SYRINGE) IMPLANT
NDL HYPO 25X5/8 SAFETYGLIDE (NEEDLE) IMPLANT
NEEDLE HYPO 25X5/8 SAFETYGLIDE (NEEDLE) IMPLANT
NS IRRIG 1000ML POUR BTL (IV SOLUTION) ×3 IMPLANT
PACK C SECTION WH (CUSTOM PROCEDURE TRAY) ×3 IMPLANT
PAD ABD 8X7 1/2 STERILE (GAUZE/BANDAGES/DRESSINGS) ×2 IMPLANT
PAD DRESSING TELFA 3X8 NADH (GAUZE/BANDAGES/DRESSINGS) IMPLANT
PAD OB MATERNITY 4.3X12.25 (PERSONAL CARE ITEMS) ×3 IMPLANT
RTRCTR C-SECT PINK 25CM LRG (MISCELLANEOUS) IMPLANT
SEPRAFILM MEMBRANE 5X6 (MISCELLANEOUS) IMPLANT
SPONGE GAUZE 2X2 8PLY STER LF (GAUZE/BANDAGES/DRESSINGS) ×1
SPONGE GAUZE 2X2 8PLY STRL LF (GAUZE/BANDAGES/DRESSINGS) ×1 IMPLANT
SUT PLAIN 0 NONE (SUTURE) ×3 IMPLANT
SUT VIC AB 0 CT1 36 (SUTURE) ×12 IMPLANT
SUT VIC AB 4-0 KS 27 (SUTURE) ×3 IMPLANT
TAPE CLOTH SURG 4X10 WHT LF (GAUZE/BANDAGES/DRESSINGS) ×2 IMPLANT
TOWEL OR 17X24 6PK STRL BLUE (TOWEL DISPOSABLE) ×3 IMPLANT
TRAY FOLEY CATH SILVER 14FR (SET/KITS/TRAYS/PACK) ×3 IMPLANT

## 2015-02-22 NOTE — H&P (Signed)
[name] is a 24 y.o. female presenting for scheduled repeat cesarean section at 39 weeks. Patient [facility]. 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 Anxiety   . Asthma   . Chlamydia   . Enlarged thyroid   . Headache   . UTI (urinary tract infection) during pregnancy   . Anemia    Past Surgical History  Procedure Laterality Date  . Cesarean section  2010  . Tonsillectomy  2009  . Dilation and evacuation  04/01/2011    Procedure: DILATATION AND EVACUATION (D&E);  Surgeon: Almon HerculesKendra H. Ross;  Location: [facility];  Service: Gynecology;  Laterality: N/A;  . Dilatation and curretage    . Tympanostomy tube placement     Family History: family history includes Depression in her mother; Diabetes in her mother; Hyperlipidemia in her mother. There is no history of Anesthesia problems. Social History:  reports that she has been smoking Cigarettes.  She has a .75 pack-year smoking history. She has never used smokeless tobacco. She reports that she drinks alcohol. She reports that she does not use illicit drugs.   Prenatal Transfer Tool  Maternal Diabetes: No Genetic Screening: unknown Maternal Ultrasounds/Referrals: Abnormal:  Findings:   Other: left multicystic kidney Fetal Ultrasounds or other Referrals:  Referred to Materal Fetal Medicine  Maternal Substance Abuse:  Reported no Significant Maternal Medications:  None Significant Maternal Lab Results:  None Other Comments:  None  Review of Systems  All other systems reviewed and are negative.     Blood pressure 109/69, pulse 138, temperature 97.7 F (36.5 C), temperature source Oral, resp. rate 18, SpO2 100 %, not currently breastfeeding. Exam Physical Exam   GENERAL: Well-developed, well-nourished female in no acute distress.  LUNGS: Clear to auscultation bilaterally.  HEART: Regular rate and rhythm. ABDOMEN: Soft, nontender, gravid with 3 previous lower abdominal  incisions PELVIC: Not indicated EXTREMITIES: No cyanosis, clubbing, or edema, 2+ distal pulses.  Prenatal labs: ABO, Rh: --/--/O POS (07/19 1325) Antibody: NEG (07/19 1325) Rubella: 6.86 (06/27 0335) RPR: Non Reactive (07/19 1325)  HBsAg: Negative (06/27 0335)  HIV: Non-reactive (06/27 0000)  GBS:     Assessment/Plan: 24 yo G4P3 at 39 weeks here for scheduled repeat cesarean section - Risks of the surgery reviewed with the patient including but not limited to risks of bleeding, infection and damage to adjacent organs. Patient verbalized understanding. Patient also desires permanent sterilization. Risks and benefits of procedure discussed with patient including permanence of method, bleeding, infection, injury to surrounding organs and need for additional procedures. Risk failure of 0.5-1% with increased risk of ectopic gestation if pregnancy occurs was also discussed with patient. Patient verbalized understanding     Timithy Arons 02/22/2015, 9:34 AM

## 2015-02-22 NOTE — Anesthesia Procedure Notes (Signed)
Spinal Patient location during procedure: OR Preanesthetic Checklist Completed: patient identified, site marked, surgical consent, pre-op evaluation, timeout performed, IV checked, risks and benefits discussed and monitors and equipment checked Spinal Block Patient position: sitting Prep: DuraPrep Patient monitoring: cardiac monitor, continuous pulse ox, blood pressure and heart rate Approach: midline Location: L3-4 Injection technique: catheter Needle Needle type: Tuohy and Sprotte  Needle gauge: 24 G Needle length: 12.7 cm Needle insertion depth: 6 cm Catheter type: closed end flexible Catheter size: 19 g Catheter at skin depth: 12 cm Additional Notes Spinal Dosage in OR  Bupivicaine ml       1.3 PFMS04   mcg        100 (-) asp Heme/CSF

## 2015-02-22 NOTE — Anesthesia Postprocedure Evaluation (Signed)
  Anesthesia Post-op Note  Patient: [name]  Procedure(s) Performed: Procedure(s): CESAREAN SECTION WITH BILATERAL TUBAL LIGATION (Bilateral)  Patient is awake, responsive, moving her legs, and has signs of resolution of her numbness. Pain and nausea are reasonably well controlled. Vital signs are stable and clinically acceptable. Oxygen saturation is clinically acceptable. There are no apparent anesthetic complications at this time. Patient is ready for discharge.

## 2015-02-22 NOTE — Anesthesia Preprocedure Evaluation (Signed)
Anesthesia Evaluation  Patient identified by MRN, date of birth, ID band Patient awake    Reviewed: Allergy & Precautions, H&P , Patient's Chart, lab work & pertinent test results  Airway Mallampati: II  TM Distance: >3 FB Neck ROM: full    Dental no notable dental hx.    Pulmonary Current Smoker,  breath sounds clear to auscultation  Pulmonary exam normal       Cardiovascular Exercise Tolerance: Good Rhythm:regular Rate:Normal     Neuro/Psych    GI/Hepatic   Endo/Other    Renal/GU      Musculoskeletal   Abdominal   Peds  Hematology   Anesthesia Other Findings Asthma Hx of CS x 2; Now 3  Reproductive/Obstetrics                             Anesthesia Physical Anesthesia Plan  ASA: II  Anesthesia Plan: Spinal   Post-op Pain Management:    Induction:   Airway Management Planned:   Additional Equipment:   Intra-op Plan:   Post-operative Plan:   Informed Consent: I have reviewed the patients History and Physical, chart, labs and discussed the procedure including the risks, benefits and alternatives for the proposed anesthesia with the patient or authorized representative who has indicated his/her understanding and acceptance.   Dental Advisory Given  Plan Discussed with: CRNA  Anesthesia Plan Comments: (Lab work confirmed with CRNA in room. Platelets okay. Discussed spinal anesthetic, and patient consents to the procedure:  included risk of possible headache,backache, failed block, allergic reaction, and nerve injury. This patient was asked if she had any questions or concerns before the procedure started. )        Anesthesia Quick Evaluation

## 2015-02-22 NOTE — Op Note (Signed)
[name] PROCEDURE DATE: 02/22/2015  PREOPERATIVE DIAGNOSIS: Intrauterine pregnancy at  1550w0d weeks gestation; previous uterine incision kerr x3 or greater and desire for permanent sterilization  POSTOPERATIVE DIAGNOSIS: The same  PROCEDURE:     Cesarean Section and bilateral tubal ligation using Pomeroy method   SURGEON:  Dr. Gigi GinPeggy Soffia Doshier  ASSISTANT: none  INDICATIONS: [name] is a 24 y.o. [id number] at 3650w0d scheduled for cesarean section secondary to previous uterine incision kerr x3 or greater.  The risks of cesarean section discussed with the patient included but were not limited to: bleeding which may require transfusion or reoperation; infection which may require antibiotics; injury to bowel, bladder, ureters or other surrounding organs; injury to the fetus; need for additional procedures including hysterectomy in the event of a life-threatening hemorrhage; placental abnormalities wth subsequent pregnancies, incisional problems, thromboembolic phenomenon and other postoperative/anesthesia complications. Patient also desires permanent sterilization. Risks and benefits of procedure discussed with patient including permanence of method, bleeding, infection, injury to surrounding organs and need for additional procedures. Risk failure of 0.5-1% with increased risk of ectopic gestation if pregnancy occurs was also discussed with patient.The patient concurred with the proposed plan, giving informed written consent for the procedure.    FINDINGS:  Viable female infant in cephalic presentation.  Apgars 9 and 9.  Clear amniotic fluid.  Intact placenta, three vessel cord.  Normal uterus, fallopian tubes and ovaries bilaterally.  ANESTHESIA:    Spinal INTRAVENOUS FLUIDS:1500 ml ESTIMATED BLOOD LOSS: 600 ml URINE OUTPUT:  100 ml SPECIMENS: Placenta sent to L&D COMPLICATIONS: None immediate  PROCEDURE IN DETAIL:  The patient received intravenous antibiotics and had sequential  compression devices applied to her lower extremities while in the preoperative area.  She was then taken to the operating room where anesthesia was induced and was found to be adequate. A foley catheter was placed into her bladder and attached to Cammy Sanjurjo gravity. She was then placed in a dorsal supine position with a leftward tilt, and prepped and draped in a sterile manner. After an adequate timeout was performed, a Pfannenstiel skin incision was made with scalpel and carried through to the underlying layer of fascia. The fascia was incised in the midline and this incision was extended bilaterally using the Mayo scissors. Kocher clamps were applied to the superior aspect of the fascial incision and the underlying rectus muscles were dissected off bluntly. A similar process was carried out on the inferior aspect of the facial incision. The rectus muscles were separated in the midline bluntly and the peritoneum was entered bluntly. The Alexis self-retaining retractor was introduced into the abdominal cavity. Attention was turned to the lower uterine segment where a bladder flap was created, and a transverse hysterotomy was made with a scalpel and extended bilaterally bluntly. The infant was successfully delivered, and cord was clamped and cut and infant was handed over to awaiting neonatology team. Uterine massage was then administered and the placenta delivered intact with three-vessel cord. The uterus was cleared of clot and debris.  The hysterotomy was closed with 0 Vicryl in a running locked fashion, and an imbricating layer was also placed with a 0 Vicryl. Overall, excellent hemostasis was noted. The pelvis copiously irrigated and cleared of all clot and debris. Hemostasis was confirmed on all surfaces. The patient's left fallopian tube was then identified, brought to the incision, and grasped with a Babcock clamp. The tube was then followed out to the fimbria. The Babcock clamp was then used to grasp the tube  approximately 4 cm from the cornual region. A 3 cm segment of the tube was then ligated with free tie of plain gut suture, transected and excised. Good hemostasis was noted and the tube was returned to the abdomen. The right fallopian tube was then identified to its fimbriated end, ligated, and a 3 cm segment excised in a similar fashion. Excellent hemostasis was noted, and the tube returned to the abdomen. The peritoneum and the muscles were reapproximated using 0 vicryl interrupted stitches. The fascia was then closed using 0 Vicryl in a running fashion.  The skin was closed in a subcuticular fashion using 3.0 Vicryl. The patient tolerated the procedure well. Sponge, lap, instrument and needle counts were correct x 2. She was taken to the recovery room in stable condition.     [name],PEGGYMD  02/22/2015 10:54 AM

## 2015-02-22 NOTE — Transfer of Care (Signed)
Immediate Anesthesia Transfer of Care Note  Patient: [name]  Procedure(s) Performed: Procedure(s): CESAREAN SECTION WITH BILATERAL TUBAL LIGATION (Bilateral)  Patient Location: PACU  Anesthesia Type:Spinal  Level of Consciousness: awake, alert  and oriented  Airway & Oxygen Therapy: Patient Spontanous Breathing  Post-op Assessment: Report given to RN and Post -op Vital signs reviewed and stable  Post vital signs: Reviewed and stable  Last Vitals:  Filed Vitals:   02/22/15 0906  BP: 109/69  Pulse: 138  Temp: 36.5 C  Resp: 18    Complications: No apparent anesthesia complications

## 2015-02-22 NOTE — Lactation Note (Signed)
This note was copied from the chart of Katie Pennie Rushingaffany Connelley. Lactation Consultation Note Initial visit at 12 hours of age.  Baby has had a few feedings and mom denies pain.  Mom is very sleepy recovering from a c/s.  Mom allows baby to suck up onto nipple to get a deeper latch. Mom continues to deny nipple pain.  Encouraged mom to wait for mouth to be wide open to allow for a deep latch and milk transfer.  Mom has experience with breastfeeding older children for a few months until her 'milk dried up" due to separation when at work.  Mom plans to pump tomorrow and get a pump from insurance. [facility] resources given and discussed.  Encouraged to feed with early cues on demand.  Early newborn behavior discussed.  Hand expression demonstrated with colostrum visible.  Mom to call for assist as needed.     Patient Name: [name] [id number]'s Date: 02/22/2015 Reason for consult: Initial assessment   Maternal Data Has patient been taught Hand Expression?: Yes Does the patient have breastfeeding experience prior to this delivery?: Yes  Feeding Feeding Type: Breast Fed Length of feed:  (several minutes)  LATCH Score/Interventions Latch: Grasps breast easily, tongue down, lips flanged, rhythmical sucking.  Audible Swallowing: None Intervention(s): Skin to skin  Type of Nipple: Everted at rest and after stimulation  Comfort (Breast/Nipple): Soft / non-tender     Hold (Positioning): Assistance needed to correctly position infant at breast and maintain latch. Intervention(s): Breastfeeding basics reviewed;Support Pillows;Position options;Skin to skin  LATCH Score: 7  Lactation Tools Discussed/Used     Consult Status Consult Status: Follow-up Date: 02/23/15 Follow-up type: In-patient    Jannifer RodneyShoptaw, Carrye Goller Lynn 02/22/2015, 10:42 PM

## 2015-02-22 NOTE — Transfer of Care (Signed)
Immediate Anesthesia Transfer of Care Note  Patient: [name]  Procedure(s) Performed: Procedure(s): CESAREAN SECTION WITH BILATERAL TUBAL LIGATION (Bilateral)  Patient Location: PACU  Anesthesia Type:Spinal  Level of Consciousness: awake, alert  and oriented  Airway & Oxygen Therapy: Patient Spontanous Breathing  Post-op Assessment: Report given to RN and Post -op Vital signs reviewed and stable  Post vital signs: Reviewed and stable  Last Vitals:  Filed Vitals:   02/22/15 0906  BP: 109/69  Pulse: 138  Temp: 36.5 C  Resp: 18    Complications: No apparent anesthesia complications 

## 2015-02-23 ENCOUNTER — Encounter (HOSPITAL_COMMUNITY): Payer: Self-pay | Admitting: Obstetrics and Gynecology

## 2015-02-23 LAB — CBC
HCT: 18.7 % — ABNORMAL LOW (ref 36.0–46.0)
Hemoglobin: 6.6 g/dL — CL (ref 12.0–15.0)
MCH: 33.7 pg (ref 26.0–34.0)
MCHC: 35.3 g/dL (ref 30.0–36.0)
MCV: 95.4 fL (ref 78.0–100.0)
PLATELETS: 160 10*3/uL (ref 150–400)
RBC: 1.96 MIL/uL — ABNORMAL LOW (ref 3.87–5.11)
RDW: 13.6 % (ref 11.5–15.5)
WBC: 14.9 10*3/uL — ABNORMAL HIGH (ref 4.0–10.5)

## 2015-02-23 MED ORDER — NITROFURANTOIN MONOHYD MACRO 100 MG PO CAPS: 100.0000 mg | ORAL_CAPSULE | Freq: Two times a day (BID) | ORAL | Status: DC

## 2015-02-23 NOTE — Progress Notes (Signed)
Post Partum Day 1 s/p rLTCS/BTL Subjective:  [name] is a 24 y.o. [id number] [redacted]w[redacted]d s/p rLTCS/BTL.  No acute events overnight.  Pt denies problems with ambulating, voiding or po intake.  She denies nausea or vomiting.  Pain is well controlled.  She has had flatus. She has not had bowel movement.  Lochia Minimal.  Plan for birth control is bilateral tubal ligation.  Method of Feeding: Breast. Pt complains of mild pressure in chest that is more noticeable when laying down or moving.  Objective: Blood pressure 97/43, pulse 66, temperature 98.2 F (36.8 C), temperature source Oral, resp. rate 20, SpO2 97 %, unknown if currently breastfeeding.  Physical Exam:  General: alert, cooperative and no distress Lochia:normal flow Chest: CTAB Heart: RRR no m/r/g Abdomen: +BS, soft, mild tenderness, incision dressed w/o drainage Uterine Fundus: firm,  DVT Evaluation: No evidence of DVT seen on physical exam. Extremities: no edema   Recent Labs  02/21/15 1325 02/23/15 0530  HGB 11.6* 6.6*  HCT 34.5* 18.7*    Assessment/Plan:  ASSESSMENT: [name] is a 24 y.o. [id number] [redacted]w[redacted]d s/p rLTCS/BTL. She appears to be progressing well.  Known Hx of anemia with low Hg but no symptoms today. Will monitor patient and assess for transfusion need; will hold for now.  Plan for discharge tomorrow and Breastfeeding   LOS: 1 day   Lowanda Foster 02/23/2015, 7:56 AM    Seen and examined by me also Filed Vitals:   02/23/15 0826  BP: 101/51  Pulse: 65  Temp: 98.2 F (36.8 C)  Resp: 18   Medical, Surgical, Family and Social histories reviewed and are listed above.  Medications and allergies reviewed.  Incision with dressing clean and intact Lochia WNL Extremities normal Agree with note above.  Aviva Signs, CNM

## 2015-02-23 NOTE — Clinical Social Work Maternal (Signed)
CLINICAL SOCIAL WORK MATERNAL/CHILD NOTE  Patient Details  Name: [name] MRN: [id number] Date of Birth: 1991-05-06  Date:  02/23/2015  Clinical Social Worker Initiating Note:  Caeson Filippi E. Brigitte Pulse, [place] Date/ Time Initiated:  02/23/15/1521     Child's Name:  Luiz Iron   Legal Guardian:   (Parents: Vilinda Blanks and Aileen Pilot)   Need for Interpreter:  None   Date of Referral:  02/22/15     Reason for Referral:  Late or No Prenatal Care , Other (Comment) (Hx of Anxiety/Depression)   Referral Source:  [facility]   Address:  [address] [place], [place] [address]  Phone number:  [phone]   Household Members:  Minor Children (MOB has three other children, ages 91, 56 and 54.  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[employer], Multimedia programmer   Other Resources:   (MOB plans to apply for [facility])   Cultural/Religious Considerations Which May Impact Care: None stated  Strengths:  Ability to meet basic needs , Home prepared for child , Pediatrician chosen  (Pediatric follow up will be at [facility])   Risk Factors/Current Problems:  None   Cognitive State:  Alert , Linear Thinking    Mood/Affect:  Other (Comment), Interested , Calm  (Patient appeared to be in pain.)   CSW Assessment: CSW met with MOB and FOB in MOB's first floor room/147 to complete assessment due to hx of Anxiety/Depression and limited PNC.  Parents were quiet, but pleasant and stated this was a good time to talk with them.  MOB gave permission to discuss anything with FOB present.  (CSW notes that it was difficult to complete the assessment because the couple's two other children came in with a visitor towards the end of the conversation.   MOB's pain also appeared to be getting in the way of her being able to converse normally.)   MOB reports doing okay, although states she is in a lot of pain.  She showed CSW her incision, which is very bruised.  CSW asked if her RN is aware and she said yes.  MOB also states that she is feeling chest pressure, "like something is sitting on my chest."  MOB endorses a hx of Anxiety, but states this does not feel like it is related to anxiety.  CSW advised that she let her RN and provider know of her concerns and the fact that she differentiates between anxiety symptoms and this pain.  MOB agreed.  MOB reports feeling well emotionally at this time.  She states she took anxiety medication in high school, but has not needed medication since.  She reports no hx of PPD with her other children.  She was attentive to information given by CSW regarding signs and symptoms of PPD to watch for and states a commitment to speak with her doctor if symptoms arise.   CSW inquired about her prenatal care.  MOB states she found out about the pregnancy around 6 months and initially sought [facility].  She states they required up front payment for services that she could not afford, so she switched to [facility].  CSW informed parents of hospital drug screen policy  due to late/limited [facility].  They report no concerns and deny all drug use.  Baby's UDS is negative.     Parents report good supports and having all needed supplies for baby at home.  CSW asked them to have their RN contact CSW if needs arise prior to discharge.  They thanked CSW.They state no questions, concerns or needs at this time.  CSW has no further questions and identifies no barriers to discharge when medically ready.  CSW spoke with bedside RN upon completion of assessment to alert her to patient's physical complaints.  RN aware.  CSW Plan/Description:  Patient/Family Education , No Further Intervention Required/No Barriers to Discharge     Kalman Shan 02/23/2015, 3:33 PM

## 2015-02-23 NOTE — Anesthesia Postprocedure Evaluation (Signed)
  Anesthesia Post-op Note  Patient: [name]  Procedure(s) Performed: Procedure(s): CESAREAN SECTION WITH BILATERAL TUBAL LIGATION (Bilateral)  Patient Location: Mother/Baby  Anesthesia Type:Spinal  Level of Consciousness: awake, alert , oriented and patient cooperative  Airway and Oxygen Therapy: Patient Spontanous Breathing  Post-op Pain: none  Post-op Assessment: Post-op Vital signs reviewed, Patient's Cardiovascular Status Stable, Respiratory Function Stable, Patent Airway, No signs of Nausea or vomiting, Adequate PO intake, Pain level controlled, No headache, No backache, Spinal receding and Patient able to bend at knees LLE Motor Response: Non-purposeful movement LLE Sensation: Tingling RLE Motor Response: Purposeful movement RLE Sensation: Tingling, Decreased      Post-op Vital Signs: Reviewed and stable  Last Vitals:  Filed Vitals:   02/23/15 0410  BP: 97/43  Pulse: 66  Temp: 36.8 C  Resp: 20    Complications: No apparent anesthesia complications

## 2015-02-23 NOTE — Progress Notes (Signed)
CRITICAL VALUE ALERT  Critical value received: Hgb 6.6  Date of notification:  02/23/2015  Time of notification:  0604  Critical value read back:Yes.    Nurse who received alert:  Neale Burly RN  MD notified (1st page):  Dr. Earlene Plater  Time of first page:  0604  MD notified (2nd page):  Time of second page:  Responding MD:  Dr. Earlene Plater  Time MD responded:  1610; No new orders

## 2015-02-23 NOTE — Telephone Encounter (Signed)
Called patients home, female answered and stated that patient is in the hospital following her csection. Rx to pharmacy so patient can pick it up after discharge.

## 2015-02-24 ENCOUNTER — Inpatient Hospital Stay (HOSPITAL_COMMUNITY): Payer: BLUE CROSS/BLUE SHIELD

## 2015-02-24 LAB — PREPARE RBC (CROSSMATCH)

## 2015-02-24 LAB — HEMOGLOBIN AND HEMATOCRIT, BLOOD
HCT: 18.9 % — ABNORMAL LOW (ref 36.0–46.0)
HEMATOCRIT: 24.2 % — AB (ref 36.0–46.0)
HEMOGLOBIN: 6.5 g/dL — AB (ref 12.0–15.0)
Hemoglobin: 8.1 g/dL — ABNORMAL LOW (ref 12.0–15.0)

## 2015-02-24 MED ORDER — FLEET ENEMA 7-19 GM/118ML RE ENEM
1.0000 | ENEMA | Freq: Once | RECTAL | Status: AC
  Administered 2015-02-25: 1 via RECTAL

## 2015-02-24 MED ORDER — BISACODYL 10 MG RE SUPP
10.0000 mg | Freq: Once | RECTAL | Status: AC
  Administered 2015-02-24: 10 mg via RECTAL
  Filled 2015-02-24: qty 1

## 2015-02-24 MED ORDER — POLYETHYLENE GLYCOL 3350 17 G PO PACK
17.0000 g | PACK | Freq: Every day | ORAL | Status: DC
  Administered 2015-02-24 – 2015-02-25 (×2): 17 g via ORAL
  Filled 2015-02-24 (×3): qty 1

## 2015-02-24 MED ORDER — SODIUM CHLORIDE 0.9 % IV SOLN
Freq: Once | INTRAVENOUS | Status: AC
  Administered 2015-02-24: 13:00:00 via INTRAVENOUS

## 2015-02-24 MED ORDER — BISACODYL 5 MG PO TBEC
5.0000 mg | DELAYED_RELEASE_TABLET | Freq: Every day | ORAL | Status: DC | PRN
  Administered 2015-02-24: 5 mg via ORAL
  Filled 2015-02-24 (×2): qty 1

## 2015-02-24 NOTE — Progress Notes (Signed)
Subjective: Postpartum Day 2: Cesarean Delivery Patient reports incisional pain and tolerating PO.    Objective: Vital signs in last 24 hours: Temp:  [97.8 F (36.6 C)-98.8 F (37.1 C)] 98.8 F (37.1 C) (07/22 1850) Pulse Rate:  [57-76] 59 (07/22 1850) Resp:  [18] 18 (07/22 1850) BP: (94-118)/(45-60) 94/45 mmHg (07/22 1850) SpO2:  [96 %-97 %] 96 % (07/22 1850)  Physical Exam:  General: cooperative, no distress and Having a lot of pain Lochia: appropriate Uterine Fundus: firm Incision: healing well, no significant drainage DVT Evaluation: No evidence of DVT seen on physical exam.   Recent Labs  02/23/15 0530 02/24/15 0530  HGB 6.6* 6.5*  HCT 18.7* 18.9*    Assessment/Plan: Status post Cesarean section. Doing well postoperatively. See below for problem list  Status post Cesarean section day #2  #Chest pain and SOB:  History, vitals, and physical exam largely reassuring for likely benign cause.  R/o atelectasis and pneumonia, decrease likelihood of pulmonary embolism. Follow closely for change in symptoms.  CXR and pulse oximetry x 1. >>  CXR was normal. Pulse Ox normal.  Encourage incentive spirometry.   #Symptomatic anemia:  Hgb stabilized at 6.6, markedly below baseline. Pt reports dizziness and possible palpitations with ambulation.  2u pRBC transfusionper recommendation Dr Debroah Loop  #Constipation:  Dulcolax and Miralax   May need Fleet enema  #Pain: Continue percocet prn    Continue current care.  [facility] 02/24/2015, 7:43 PM

## 2015-02-24 NOTE — Lactation Note (Signed)
This note was copied from the chart of [name]. Lactation Consultation Note  Patient Name: [name] [id number] Date: 02/24/2015 Reason for consult: Follow-up assessment;Infant weight loss;Other (Comment) (Mom's hgb low, receiving transfusion today, per mom.) Baby 47 hours old. Mom states that she is supplementing baby with formula because she feels baby is not getting enough at breast. Mom states that she had low supply with first child as well. Set mom up with DEBP and enc mom to pump at least every 3 hours for 15 minutes. Mom is eating a meal and states she will start pumping after she finishes her food. Enc mom to continue to put baby to breast with cues, supplement with EBM/formula, and pump for 15 minutes. Enc mom to continue supplementing. Enc mom to call for assistance as needed. Mom given 2-week pump rental paperwork and enc to call WIC--mom states that she has not been active with WIC.  Maternal Data    Feeding Feeding Type: Bottle Fed - Formula Length of feed: 15 min  LATCH Score/Interventions Latch: Grasps breast easily, tongue down, lips flanged, rhythmical sucking.  Audible Swallowing: A few with stimulation  Type of Nipple: Everted at rest and after stimulation  Comfort (Breast/Nipple): Filling, red/small blisters or bruises, mild/mod discomfort  Problem noted: Cracked, bleeding, blisters, bruises;Mild/Moderate discomfort  Hold (Positioning): Assistance needed to correctly position infant at breast and maintain latch.  LATCH Score: 7  Lactation Tools Discussed/Used Pump Review: Setup, frequency, and cleaning;Milk Storage Initiated by:: JW Date initiated:: 02/24/15   Consult Status Consult Status: Follow-up Date: 02/25/15 Follow-up type: In-patient    Geralynn Ochs 02/24/2015, 11:27 AM

## 2015-02-24 NOTE — Progress Notes (Signed)
Dr. Freida Busman informed that patient has still not moved her bowels at this time. Patient states that she has passed a minimal amount of flatus. She received 5 mg. Bisacodyl PO at 1108, Miralax 7 gms. PO at 1250, and a bisacodyl 10 mg. Suppository at 1743 without results.

## 2015-02-24 NOTE — Progress Notes (Signed)
Patient ID: [name], female   DOB: January 10, 1991, 24 y.o.   MRN: [id number] Subjective:  Postpartum Day 2: Cesarean Delivery and Bilateral Tubal Ligation  [name] is a 24 y.o. [id number] [redacted]w[redacted]d s/p LTCS and BTL.  No acute events overnight.  Pt has significant pain at incision which is limiting her mobility.  Pt reports intermittent dizziness with ambulation. She had had flatus. She has not had a bowel movement and notes long h/o constipation. Pt denies problems with voiding or po intake.  She denies nausea or vomiting. Lochia is minimal.  Plan for birth control is bilateral tubal ligation.  Method of Feeding: Breast.  Pt c/o chest pain radiating to the left shoulder with exertion. Chest pain began after surgery and has not changed from onset. Endorses SOB and dyspnea since CP onset that has not progressed; she has not been utilizing incentive spirometry  Pt is unsure if she has episodes of palpitations with exertion or due to incision site pain. CP is exacerbated by exertion and swallowing. It is somewhat lessened with percocet, lying down, and "holding my chest" while walking. FHx notable for mother with three myocardial infactions beginning in her 62's. Pt has belched 1-2 times during hospital stay and denies h/o GERD. No sweating, change in vision, new swelling, or cough.   Objective:  Vital signs in last 24 hours: Temp:  [98.3 F (36.8 C)] 98.3 F (36.8 C) (07/22 0552) Pulse Rate:  [57] 57 (07/22 0552) Resp:  [18] 18 (07/22 0552) BP: (98-116)/(46-62) 98/46 mmHg (07/22 0552)  Physical Exam:  General: alert, cooperative, appears stated age and mild distress Lochia: appropriate Uterine Fundus: firm Incision: no significant drainage, no significant erythema DVT Evaluation: No evidence of DVT seen on physical exam. Negative Homan's sign. No cords or calf tenderness. No significant calf/ankle edema. Chest: TTP throughout, particularly along sternum and sternal borders bilaterally   Heart: RRR without mrg Lungs: Nml wob. Intermittent end-expiration wheezes noted throughout. Decreased breath sounds at left lower lobe.  Abd: Exquisite TTP limited exam. Nondistended. +BS. Significant ecchymoses at level of umbilicus and below. Incision dressed without drainage. Extremities: No significant LE edema. dorsalis pedis 1+ b/l.     Recent Labs  02/23/15 0530 02/24/15 0530  HGB 6.6* 6.5*  HCT 18.7* 18.9*    Assessment/Plan: Status post Cesarean section day #3.   #Chest pain and SOB:   History, vitals, and physical exam largely reassuring for likely benign cause.   R/o atelectasis and pneumonia, decrease likelihood of pulmonary embolism. Follow closely for change in symptoms.   CXR and pulse oximetry x 1. Encourage incentive spirometry.   #Symptomatic anemia:   Hgb stabilized at 6.6, markedly below baseline. Pt reports dizziness and possible palpitations with ambulation.   2u pRBC transfusion    #Constipation:   Dulcolax and Miralax   #Pain:  Continue percocet prn   If results reassuring, can d/c to home.   Parks Ranger 02/24/2015, 11:42 AM

## 2015-02-25 LAB — TYPE AND SCREEN
ABO/RH(D): O POS
ANTIBODY SCREEN: NEGATIVE
UNIT DIVISION: 0
Unit division: 0

## 2015-02-25 MED ORDER — OXYCODONE-ACETAMINOPHEN 5-325 MG PO TABS: 1.0000 | ORAL_TABLET | ORAL | Status: DC | PRN

## 2015-02-25 MED ORDER — ALBUTEROL SULFATE (2.5 MG/3ML) 0.083% IN NEBU: 3.0000 mL | INHALATION_SOLUTION | Freq: Four times a day (QID) | RESPIRATORY_TRACT | Status: DC | PRN

## 2015-02-25 MED ORDER — POLYETHYLENE GLYCOL 3350 17 G PO PACK: 17.0000 g | PACK | Freq: Two times a day (BID) | ORAL | Status: DC | PRN

## 2015-02-25 MED ORDER — FERROUS SULFATE 325 (65 FE) MG PO TABS
325.0000 mg | ORAL_TABLET | Freq: Two times a day (BID) | ORAL | Status: DC
Start: 2015-02-25 — End: 2024-06-03

## 2015-02-25 MED ORDER — ALBUTEROL SULFATE HFA 108 (90 BASE) MCG/ACT IN AERS: 1.0000 | INHALATION_SPRAY | Freq: Four times a day (QID) | RESPIRATORY_TRACT | Status: DC | PRN

## 2015-02-25 MED ORDER — IBUPROFEN 600 MG PO TABS: 600.0000 mg | ORAL_TABLET | Freq: Four times a day (QID) | ORAL | Status: DC | PRN

## 2015-02-25 NOTE — Discharge Instructions (Signed)
Cesarean Delivery, Care After °Refer to this sheet in the next few weeks. These instructions provide you with information on caring for yourself after your procedure. Your health care provider may also give you specific instructions. Your treatment has been planned according to current medical practices, but problems sometimes occur. Call your health care provider if you have any problems or questions after you go home. °HOME CARE INSTRUCTIONS  °· Only take over-the-counter or prescription medications as directed by your health care provider. °· Do not drink alcohol, especially if you are breastfeeding or taking medication to relieve pain. °· Do not chew or smoke tobacco. °· Continue to use good perineal care. Good perineal care includes: °· Wiping your perineum from front to back. °· Keeping your perineum clean. °· Check your surgical cut (incision) daily for increased redness, drainage, swelling, or separation of skin. °· Clean your incision gently with soap and water every day, and then pat it dry. If your health care provider says it is okay, leave the incision uncovered. Use a bandage (dressing) if the incision is draining fluid or appears irritated. If the adhesive strips across the incision do not fall off within 7 days, carefully peel them off. °· Hug a pillow when coughing or sneezing until your incision is healed. This helps to relieve pain. °· Do not use tampons or douche until your health care provider says it is okay. °· Shower, wash your hair, and take tub baths as directed by your health care provider. °· Wear a well-fitting bra that provides breast support. °· Limit wearing support panties or control-top hose. °· Drink enough fluids to keep your urine clear or pale yellow. °· Eat high-fiber foods such as whole grain cereals and breads, brown rice, beans, and fresh fruits and vegetables every day. These foods may help prevent or relieve constipation. °· Resume activities such as climbing stairs,  driving, lifting, exercising, or traveling as directed by your health care provider. °· Talk to your health care provider about resuming sexual activities. This is dependent upon your risk of infection, your rate of healing, and your comfort and desire to resume sexual activity. °· Try to have someone help you with your household activities and your newborn for at least a few days after you leave the hospital. °· Rest as much as possible. Try to rest or take a nap when your newborn is sleeping. °· Increase your activities gradually. °· Keep all of your scheduled postpartum appointments. It is very important to keep your scheduled follow-up appointments. At these appointments, your health care provider will be checking to make sure that you are healing physically and emotionally. °SEEK MEDICAL CARE IF:  °· You are passing large clots from your vagina. Save any clots to show your health care provider. °· You have a foul smelling discharge from your vagina. °· You have trouble urinating. °· You are urinating frequently. °· You have pain when you urinate. °· You have a change in your bowel movements. °· You have increasing redness, pain, or swelling near your incision. °· You have pus draining from your incision. °· Your incision is separating. °· You have painful, hard, or reddened breasts. °· You have a severe headache. °· You have blurred vision or see spots. °· You feel sad or depressed. °· You have thoughts of hurting yourself or your newborn. °· You have questions about your care, the care of your newborn, or medications. °· You are dizzy or light-headed. °· You have a rash. °· You   have pain, redness, or swelling at the site of the removed intravenous access (IV) tube.  You have nausea or vomiting.  You stopped breastfeeding and have not had a menstrual period within 12 weeks of stopping.  You are not breastfeeding and have not had a menstrual period within 12 weeks of delivery.  You have a fever. SEEK  IMMEDIATE MEDICAL CARE IF:  You have persistent pain.  You have chest pain.  You have shortness of breath.  You faint.  You have leg pain.  You have stomach pain.  Your vaginal bleeding saturates 2 or more sanitary pads in 1 hour. MAKE SURE YOU:   Understand these instructions.  Will watch your condition.  Will get help right away if you are not doing well or get worse. Document Released: 04/13/2002 Document Revised: 12/06/2013 Document Reviewed: 03/18/2012 [facility] Patient Information 2015 [place], [place]. This information is not intended to replace advice given to you by your health care provider. Make sure you discuss any questions you have with your health care provider.  Preventing Constipation After Surgery Constipation is when a person has fewer than 3 bowel movements a week; has difficulty having a bowel movement; or has stools that are dry, hard, or larger than normal. Many things can make constipation likely after surgery. They include:  Medicines, especially numbing medicines (anesthetics) and very strong pain medicines called narcotics.  Feeling stressed because of the surgery.  Eating different foods than normal.  Being less active. Symptoms of constipation include:  Having fewer than 3 bowel movements a week.  Straining to have a bowel movement.  Having hard, dry, or larger-than-normal stools.  Feeling full or bloated.  Having pain in the lower abdomen.  Not feeling relief after having a bowel movement. HOME CARE INSTRUCTIONS  Diet  Eat foods that have a lot of fiber. These include fruits, vegetables, whole grains, and beans. Limit foods high in fat and processed sugars. These include french fries, hamburgers, cookies, and candy.  Take a fiber supplement as directed. If you are not taking a fiber supplement and think that you are not getting enough fiber from foods, talk to your health care provider about adding a fiber supplement to your  diet.  Drink clear fluids, especially water. Avoid drinking alcohol, caffeine, and soda. These can make constipation worse.  Drink enough fluids to keep your urine clear or pale yellow. Activity   After surgery, return to your normal activities slowly or when your health care provider says it is okay.  Start walking as soon as you can. Try to go a little farther each day.  Once your health care provider approves, do some sort of regular exercise. This helps prevent constipation. Bowel Movements  Go to the restroom when you have the urge to go. Do not hold it in.  Try drinking something hot to get a bowel movement started.  Keep track of how often you use the restroom. If you miss 2-3 bowel movements, talk to your health care provider about medicines that prevent constipation. Your health care provider may suggest a stool softener, laxative, or fiber supplement.  Only take over-the-counter or prescription medicines as directed by your health care provider.  Do not take other medicines without talking to your health care provider first. If you become constipated and take a medicine to make you have a bowel movement, the problem may get worse. Other kinds of medicine can also make the problem worse. SEEK MEDICAL CARE IF:  You used stool  softeners or laxatives and still have not had a bowel movement within 24-48 hours after using them.  You have not had a bowel movement in 3 days. SEEK IMMEDIATE MEDICAL CARE IF:   Your constipation lasts for more than 4 days or gets worse.  You have bright red blood in your stool.  You have abdominal or rectal pain.  You have very bad cramping.  You have thin, pencil-like stools.  You have unexplained weight loss.  You have a fever or persistent symptoms for more than 2-3 days.  You have a fever and your symptoms suddenly get worse. Document Released: 11/16/2012 Document Revised: 12/06/2013 Document Reviewed: 11/16/2012 [facility] Patient  Information 2015 [place], [place]. This information is not intended to replace advice given to you by your health care provider. Make sure you discuss any questions you have with your health care provider.

## 2015-02-25 NOTE — Discharge Summary (Signed)
Obstetric Discharge Summary Reason for Admission: cesarean section Prenatal Procedures: ultrasound Intrapartum Procedures: cesarean: low cervical, transverse and tubal ligation Postpartum Procedures: transfusion 2 uts Complications-Operative and Postpartum: much brusing around incision up to umbilicus HEMOGLOBIN  Date Value Ref Range Status  02/24/2015 8.1* 12.0 - 15.0 g/dL Final   HGB  Date Value Ref Range Status  11/02/2013 9.1* 12.0-16.0 g/dL Final   HCT  Date Value Ref Range Status  02/24/2015 24.2* 36.0 - 46.0 % Final  11/02/2013 26.3* 35.0-47.0 % Final    Physical Exam:  General: alert, cooperative, appears stated age and no distress Lochia: appropriate Uterine Fundus: firm Incision: healing well, no significant drainage, no dehiscence DVT Evaluation: No evidence of DVT seen on physical exam. Negative Homan's sign. No cords or calf tenderness.  Discharge Diagnoses: Term Pregnancy-delivered  Discharge Information: Date: 02/25/2015 Activity: pelvic rest Diet: routine Medications: PNV, Ibuprofen, Iron and Percocet Condition: stable and improved Instructions: refer to practice specific booklet Discharge to: home   Newborn Data: Live born female  Birth Weight: 7 lb 6.2 oz (3350 g) APGAR: 9, 9  Home with mother.  [name] 02/25/2015, 9:37 AM

## 2015-02-25 NOTE — Progress Notes (Signed)
Patient ID: [name], female   DOB: 04-20-1991, 24 y.o.   MRN: [id number] RN notified CNM that pt had small BM since dilcolax, but not likely adequate. Pt very crampy. Will order Fleet's enema and CTO closely. CXR earlier today showed bowel gas and possible mild ileus.    [place], CNM 02/25/2015 12:15 AM

## 2015-02-25 NOTE — Discharge Summary (Signed)
Obstetric Discharge Summary Reason for Admission: cesarean section Prenatal Procedures: ultrasound Intrapartum Procedures: cesarean: low cervical, transverse and tubal ligation Postpartum Procedures: transfusion Complications-Operative and Postpartum: constipation-(albel to have normal BM), anemia, severe abd bruising-possible hematoma. Symptomatic anemia noted PPD#2. 2U PRBCs transfused.   CBC Latest Ref Rng 02/24/2015 02/24/2015 02/23/2015  WBC 4.0 - 10.5 K/uL - - 14.9(H)  Hemoglobin 12.0 - 15.0 g/dL 8.1(L) 6.5(LL) 6.6(LL)  Hematocrit 36.0 - 46.0 % 24.2(L) 18.9(L) 18.7(L)  Platelets 150 - 400 K/uL - - 160   Physical Exam:  General: alert, appears older than stated age and mild distress Lochia: appropriate Uterine Fundus: firm Incision: no significant drainage, no dehiscence, small amount of old blood on dressing. Large of amount of bruising across abs at level of umbilicus.   DVT Evaluation: Negative Homan's sign. No cords or calf tenderness. No significant calf/ankle edema.  Discharge Diagnoses: Term Pregnancy-delivered and anemia, constipation.   Discharge Information: Date: 02/25/2015 Activity: pelvic rest Diet: routine and increase dietary iron.  Medications: PNV, Ibuprofen, Iron, Percocet and Miralax Condition: stable Instructions: refer to practice specific booklet Discharge to: home Follow-up Information    Follow up with [facility] In 6 weeks.   Specialty:  Obstetrics and Gynecology   Why:  For postpartum visit   Contact information:   [address] [place] [address] [phone]      Follow up with [facility] [place] [facility] ADMISS[facility].   Why:  As needed in emergencies   Contact information:   [address] [id number] mc [place] [address] [phone]      Newborn Data: Live born female  Birth Weight: 7 lb 6.2 oz (3350 g) APGAR: 9, 9  Home with mother. Dr. Emelda Fear to assess  bruising.   [name] 02/25/2015, 9:46 AM

## 2015-02-25 NOTE — Lactation Note (Signed)
This note was copied from the chart of Boy Ulah Olmo. Lactation Consultation Note: Observed mother breastfeeding infant in cradle hold. Infant has a shallow latch . Mother states that she is feeling some pinching. Assist mother with bringing infant closer and adjusting infants lower and upper lips for wider gape. Observed infant with good burst of suckling and swallows. Mother taught breast compression. Observed good milk transfer.  Mother very tired today. She was advised to nap frequently and cue base feed infant. Suggest that mother supplement with EBM/formula after each feeding. Mother has an electric pump at the bedside. She plans to pump after this  feeding. Mother is not active with WIC. She has a hand pump . She was advised to pump for 15 mins on each breast after breastfeeding when arrive home. Mother was given comfort gels. Mother advised to phone [facility] on Monday to apply for certification . She will go to [facility] dept. For services. Advised mother to do good breast massage and ice to prevent engorgement,. Mother receptive to all teaching.   Patient Name: Boy Vassie Kugel [id number] Date: 02/25/2015 Reason for consult: Follow-up assessment   Maternal Data    Feeding Feeding Type: Breast Fed Length of feed:  (observed for 15 mins. infant still feeding )  LATCH Score/Interventions Latch: Grasps breast easily, tongue down, lips flanged, rhythmical sucking.  Audible Swallowing: Spontaneous and intermittent  Type of Nipple: Everted at rest and after stimulation  Comfort (Breast/Nipple): Filling, red/small blisters or bruises, mild/mod discomfort  Problem noted: Filling;Cracked, bleeding, blisters, bruises;Mild/Moderate discomfort Interventions (Filling): Hand pump Interventions  (Cracked/bleeding/bruising/blister): Expressed breast milk to nipple Interventions (Mild/moderate discomfort): Comfort gels  Hold (Positioning): Assistance needed to correctly position infant at breast  and maintain latch. (advised holding infant close ) Intervention(s): Support Pillows;Position options  LATCH Score: 8  Lactation Tools Discussed/Used     Consult Status Consult Status: Complete    Michel Bickers 02/25/2015, 12:07 PM

## 2015-02-28 ENCOUNTER — Telehealth: Payer: Self-pay | Admitting: Obstetrics and Gynecology

## 2015-02-28 ENCOUNTER — Inpatient Hospital Stay (HOSPITAL_COMMUNITY)
Admission: AD | Admit: 2015-02-28 | Discharge: 2015-02-28 | Disposition: A | Discharge status: Home / Self Care | Payer: BLUE CROSS/BLUE SHIELD | Source: Ambulatory Visit | Attending: Obstetrics and Gynecology | Admitting: Obstetrics and Gynecology

## 2015-02-28 ENCOUNTER — Inpatient Hospital Stay (HOSPITAL_COMMUNITY): Payer: BLUE CROSS/BLUE SHIELD

## 2015-02-28 DIAGNOSIS — S3011XA Contusion of abdominal wall, initial encounter: Secondary | ICD-10-CM | POA: Diagnosis present

## 2015-02-28 DIAGNOSIS — R1030 Lower abdominal pain, unspecified: Secondary | ICD-10-CM

## 2015-02-28 DIAGNOSIS — S301XXD Contusion of abdominal wall, subsequent encounter: Secondary | ICD-10-CM

## 2015-02-28 DIAGNOSIS — G8918 Other acute postprocedural pain: Secondary | ICD-10-CM | POA: Diagnosis not present

## 2015-02-28 DIAGNOSIS — O9089 Other complications of the puerperium, not elsewhere classified: Secondary | ICD-10-CM | POA: Diagnosis not present

## 2015-02-28 DIAGNOSIS — R601 Generalized edema: Secondary | ICD-10-CM | POA: Insufficient documentation

## 2015-02-28 DIAGNOSIS — N1330 Unspecified hydronephrosis: Secondary | ICD-10-CM | POA: Insufficient documentation

## 2015-02-28 DIAGNOSIS — Z9889 Other specified postprocedural states: Secondary | ICD-10-CM | POA: Diagnosis not present

## 2015-02-28 DIAGNOSIS — IMO0002 Reserved for concepts with insufficient information to code with codable children: Secondary | ICD-10-CM

## 2015-02-28 DIAGNOSIS — Z98891 History of uterine scar from previous surgery: Secondary | ICD-10-CM

## 2015-02-28 DIAGNOSIS — S301XXA Contusion of abdominal wall, initial encounter: Secondary | ICD-10-CM | POA: Diagnosis present

## 2015-02-28 DIAGNOSIS — R109 Unspecified abdominal pain: Secondary | ICD-10-CM | POA: Diagnosis present

## 2015-02-28 LAB — URINALYSIS, ROUTINE W REFLEX MICROSCOPIC
Bilirubin Urine: NEGATIVE
GLUCOSE, UA: NEGATIVE mg/dL
KETONES UR: NEGATIVE mg/dL
Leukocytes, UA: NEGATIVE
Nitrite: NEGATIVE
Protein, ur: NEGATIVE mg/dL
Urobilinogen, UA: 0.2 mg/dL (ref 0.0–1.0)
pH: 7 (ref 5.0–8.0)

## 2015-02-28 LAB — URINE MICROSCOPIC-ADD ON

## 2015-02-28 LAB — CBC
HEMATOCRIT: 29.9 % — AB (ref 36.0–46.0)
HEMOGLOBIN: 9.9 g/dL — AB (ref 12.0–15.0)
MCH: 32 pg (ref 26.0–34.0)
MCHC: 33.1 g/dL (ref 30.0–36.0)
MCV: 96.8 fL (ref 78.0–100.0)
PLATELETS: 303 10*3/uL (ref 150–400)
RBC: 3.09 MIL/uL — ABNORMAL LOW (ref 3.87–5.11)
RDW: 14.9 % (ref 11.5–15.5)
WBC: 11.5 10*3/uL — ABNORMAL HIGH (ref 4.0–10.5)

## 2015-02-28 MED ORDER — IOHEXOL 300 MG/ML  SOLN
100.0000 mL | Freq: Once | INTRAMUSCULAR | Status: AC | PRN
  Administered 2015-02-28: 100 mL via INTRAVENOUS

## 2015-02-28 MED ORDER — IOHEXOL 300 MG/ML  SOLN
50.0000 mL | INTRAMUSCULAR | Status: AC
  Administered 2015-02-28: 50 mL via ORAL

## 2015-02-28 MED ORDER — SODIUM CHLORIDE 0.9 % IV SOLN: INTRAVENOUS | Status: DC

## 2015-02-28 MED ORDER — OXYCODONE-ACETAMINOPHEN 5-325 MG PO TABS: 1.0000 | ORAL_TABLET | Freq: Four times a day (QID) | ORAL | Status: DC | PRN

## 2015-02-28 MED ORDER — IBUPROFEN 600 MG PO TABS: 600.0000 mg | ORAL_TABLET | Freq: Four times a day (QID) | ORAL | Status: DC | PRN

## 2015-02-28 MED ORDER — IBUPROFEN 600 MG PO TABS
600.0000 mg | ORAL_TABLET | Freq: Once | ORAL | Status: AC
  Administered 2015-02-28: 600 mg via ORAL
  Filled 2015-02-28: qty 1

## 2015-02-28 MED ORDER — OXYCODONE-ACETAMINOPHEN 5-325 MG PO TABS
2.0000 | ORAL_TABLET | Freq: Once | ORAL | Status: AC
  Administered 2015-02-28: 2 via ORAL
  Filled 2015-02-28: qty 2

## 2015-02-28 NOTE — MAU Provider Note (Signed)
Chief Complaint:  No chief complaint on file.   First Provider Initiated Contact with Patient 02/28/15 1658     HPI: [name] is a 24 y.o. [id number] at 6 days S/P RLTCS on 02/22/15 who presents to maternity admissions reporting severe, worsening abd bruising and pain since delivery. Was Dx w/ possible sub-facial hematoma. Received 2 Units PRBC after delivery Hgb 8.8 at D/C. Pt states pain is in the skin in the bruised area. Partial relief w/ Percocet, but is running out. Rates pain 6/10 on pain scale. Last Percocet this morning ~8 hours ago. Denies fever, chills, Pain aggravated by mvmt.   Past Medical History: Past Medical History  Diagnosis Date  . Anxiety   . Asthma   . Chlamydia   . Enlarged thyroid   . Headache   . UTI (urinary tract infection) during pregnancy   . Anemia     Past obstetric history: OB History  Gravida Para Term Preterm AB SAB TAB Ectopic Multiple Living  0 0 0 4    # Outcome Date GA Lbr Len/2nd Weight Sex Delivery Anes PTL Lv  6 Term 02/22/15 [redacted]w[redacted]d  7 lb 6.2 oz (3.35 kg) M CS-LTranv Spinal,EPI  Y  5 Term 10/26/13 109w0d  6 lb 7 oz (2.92 kg) M CS-LTranv Spinal  Y     Complications: Fetal heart deceleration,Fetal distress  4 Preterm 02/25/12 [redacted]w[redacted]d  7 lb (3.175 kg) F CS-LTranv Spinal  Y  3 SAB 2012          2 Preterm 10/16/08 [redacted]w[redacted]d  4 lb 10 oz (2.098 kg) F CS-LTranv EPI  Y     Complications: Fetal distress     Comments: 35 wks, c/s for fetal distress  1 SAB 2009              Past Surgical History: Past Surgical History  Procedure Laterality Date  . Cesarean section  2010  . Tonsillectomy  2009  . Dilation and evacuation  04/01/2011    Procedure: DILATATION AND EVACUATION (D&E);  Surgeon: Almon Hercules;  Location: [facility] ORS;  Service: Gynecology;  Laterality: N/A;  . Dilatation and curretage    . Tympanostomy tube placement    . Cesarean section with bilateral tubal ligation Bilateral 02/22/2015    Procedure: CESAREAN SECTION WITH BILATERAL  TUBAL LIGATION;  Surgeon: Catalina Antigua, MD;  Location: [facility] ORS;  Service: Obstetrics;  Laterality: Bilateral;     Family History: Family History  Problem Relation Age of Onset  . Depression Mother   . Hyperlipidemia Mother   . Diabetes Mother   . Anesthesia problems Neg Hx     Social History: History  Substance Use Topics  . Smoking status: Current Every Day Smoker -- 0.25 packs/day for 3 years    Types: Cigarettes  . Smokeless tobacco: Never Used  . Alcohol Use: Yes    Allergies: No Known Allergies  Meds:  Prescriptions prior to admission  Medication Sig Dispense Refill Last Dose  . acetaminophen (TYLENOL) 500 MG tablet Take 500 mg by mouth every 6 (six) hours as needed for mild pain or headache.    Past Month at Unknown time  . albuterol (PROVENTIL HFA;VENTOLIN HFA) 108 (90 BASE) MCG/ACT inhaler Inhale 1-2 puffs into the lungs every 6 (six) hours as needed for wheezing. 1 Inhaler 0   . albuterol (PROVENTIL) (2.5 MG/3ML) 0.083% nebulizer solution Inhale 3 mLs into the lungs every 6 (six) hours as needed for wheezing.  75 mL 12   . ferrous sulfate (FERROUSUL) 325 (65 FE) MG tablet Take 1 tablet (325 mg total) by mouth 2 (two) times daily. 60 tablet 1   . ibuprofen (ADVIL,MOTRIN) 600 MG tablet Take 1 tablet (600 mg total) by mouth every 6 (six) hours as needed for cramping. 30 tablet 1   . oxyCODONE-acetaminophen (PERCOCET/ROXICET) 5-325 MG per tablet Take 1-2 tablets by mouth every 4 (four) hours as needed (for pain scale 4-7). 30 tablet 0   . polyethylene glycol (MIRALAX / GLYCOLAX) packet Take 17 g by mouth 2 (two) times daily as needed. 14 each 3   . prenatal vitamin w/FE, FA (PRENATAL 1 + 1) 27-1 MG TABS tablet Take 1 tablet by mouth daily at 12 noon.   Past Week at Unknown time    ROS:  Review of Systems  Constitutional: Negative for fever and chills.  Gastrointestinal: Positive for abdominal pain (superficial. ). Negative for nausea, vomiting, diarrhea and constipation.   Genitourinary: Positive for frequency. Negative for dysuria, urgency, hematuria and flank pain.       Scant VB.   Musculoskeletal: Negative for myalgias.  Skin:       Severe low abd bruising  Neurological: Negative for dizziness and weakness.    Physical Exam  Blood pressure 126/78, pulse 82, temperature 98 F (36.7 C), temperature source Oral, resp. rate 18, currently breastfeeding. GENERAL: Well-developed, well-nourished female in no acute distress. No Pallor HEART: normal rate RESP: normal effort GI: Abd soft except for superficial firm areas w/in bruising, mildly tender. Fundus non-palpable. MS: Extremities nontender, no edema, normal ROM NEURO: Alert and oriented x 4.  GU: NEFG, scant dark red blood on pad. No CVAT. Spec exam exam deferred    Labs: Results for orders placed or performed during the hospital encounter of 02/28/15 (from the past 24 hour(s))  CBC     Status: Abnormal   Collection Time: 02/28/15  4:32 PM  Result Value Ref Range   WBC 11.5 (H) 4.0 - 10.5 K/uL   RBC 3.09 (L) 3.87 - 5.11 MIL/uL   Hemoglobin 9.9 (L) 12.0 - 15.0 g/dL   HCT 16.1 (L) 09.6 - 04.5 %   MCV 96.8 78.0 - 100.0 fL   MCH 32.0 26.0 - 34.0 pg   MCHC 33.1 30.0 - 36.0 g/dL   RDW 40.9 81.1 - 91.4 %   Platelets 303 150 - 400 K/uL    Imaging:    MAU Course: CBC, CT w/ contrast, UA.   Care of pt turned over to Wynelle Bourgeois, [place] at 2000. Awaiting CT results.   [place], CNM 02/28/2015 7:40 PM  CT result reviewed:  IMPRESSION: 1. Air and fluid within the left rectus abdominis sheath above the incision over a length of approximately 18 cm craniocaudal and thickness of 12 mm. No significant fluid collection or soft tissue air superficial or deep to the abdominal wall musculature. There is mild generalized edema of the subcutaneous tissues of the anterior abdominal wall. 2. Moderate right hydronephrosis which appears to be due to the postpartum uterus. 3. Unremarkable  appearances of the postpartum uterus 4. No evidence of hematoma or other drainable collection within the abdomen or pelvis.  UA is negative  Discussed with Dr Jolayne Panther who recommends warm compresses and analgesics  Will follow up in clinic in 1-2 weeks.  Aviva Signs, CNM

## 2015-02-28 NOTE — MAU Note (Addendum)
Had c/s and tubal on the 20. Bruising is getting worse.  Having 'horrific' pain.  Has been taking the pain medication but she seems to really be going through them, or she can't do anything. (lower 1/3 of abd is solid bruise, purple red in color)

## 2015-02-28 NOTE — Telephone Encounter (Signed)
I spoke to [employer], pt's grandmother, to see if pt had appt for this week in clinic in short interval followup of recent c-section. The patient apparently does not, and the abd bruising is reported to be increased on abd wall. Pt has not sought care yet, but I advised that pt be seen in MAU to evaluate abdomen, consider u/s or other diagnostics, and have notified MAU and on-call provider.

## 2015-02-28 NOTE — Discharge Instructions (Signed)

## 2015-03-09 ENCOUNTER — Encounter: Payer: BLUE CROSS/BLUE SHIELD | Admitting: Family Medicine

## 2015-03-09 ENCOUNTER — Telehealth: Payer: Self-pay | Admitting: *Deleted

## 2015-03-09 ENCOUNTER — Encounter: Payer: Self-pay | Admitting: *Deleted

## 2015-03-09 NOTE — Telephone Encounter (Signed)
[name] missed a scheduled appointment to follow up a post-op hematoma.  Called home and mobile numbers and left a message she missed a scheduled appointment and needs to reschedule. Will also send letter.

## 2015-03-30 ENCOUNTER — Ambulatory Visit: Payer: BLUE CROSS/BLUE SHIELD | Admitting: Obstetrics and Gynecology

## 2015-09-18 ENCOUNTER — Emergency Department
Admission: EM | Admit: 2015-09-18 | Discharge: 2015-09-18 | Disposition: A | Discharge status: Home / Self Care | Payer: BLUE CROSS/BLUE SHIELD | Attending: Emergency Medicine | Admitting: Emergency Medicine

## 2015-09-18 ENCOUNTER — Encounter: Payer: Self-pay | Admitting: Emergency Medicine

## 2015-09-18 DIAGNOSIS — Z792 Long term (current) use of antibiotics: Secondary | ICD-10-CM | POA: Diagnosis not present

## 2015-09-18 DIAGNOSIS — F17210 Nicotine dependence, cigarettes, uncomplicated: Secondary | ICD-10-CM | POA: Insufficient documentation

## 2015-09-18 DIAGNOSIS — F410 Panic disorder [episodic paroxysmal anxiety] without agoraphobia: Secondary | ICD-10-CM

## 2015-09-18 DIAGNOSIS — F419 Anxiety disorder, unspecified: Secondary | ICD-10-CM | POA: Insufficient documentation

## 2015-09-18 DIAGNOSIS — R45850 Homicidal ideations: Secondary | ICD-10-CM | POA: Insufficient documentation

## 2015-09-18 DIAGNOSIS — F4322 Adjustment disorder with anxiety: Secondary | ICD-10-CM

## 2015-09-18 DIAGNOSIS — R45851 Suicidal ideations: Secondary | ICD-10-CM

## 2015-09-18 DIAGNOSIS — Z79899 Other long term (current) drug therapy: Secondary | ICD-10-CM | POA: Diagnosis not present

## 2015-09-18 DIAGNOSIS — F3340 Major depressive disorder, recurrent, in remission, unspecified: Secondary | ICD-10-CM | POA: Diagnosis not present

## 2015-09-18 DIAGNOSIS — F339 Major depressive disorder, recurrent, unspecified: Secondary | ICD-10-CM

## 2015-09-18 LAB — ACETAMINOPHEN LEVEL

## 2015-09-18 LAB — COMPREHENSIVE METABOLIC PANEL
ALK PHOS: 71 U/L (ref 38–126)
ALT: 17 U/L (ref 14–54)
ANION GAP: 8 (ref 5–15)
AST: 16 U/L (ref 15–41)
Albumin: 4.8 g/dL (ref 3.5–5.0)
BUN: 12 mg/dL (ref 6–20)
CALCIUM: 9.2 mg/dL (ref 8.9–10.3)
CO2: 27 mmol/L (ref 22–32)
CREATININE: 0.81 mg/dL (ref 0.44–1.00)
Chloride: 104 mmol/L (ref 101–111)
Glucose, Bld: 91 mg/dL (ref 65–99)
Potassium: 4.4 mmol/L (ref 3.5–5.1)
SODIUM: 139 mmol/L (ref 135–145)
TOTAL PROTEIN: 7.6 g/dL (ref 6.5–8.1)
Total Bilirubin: 0.8 mg/dL (ref 0.3–1.2)

## 2015-09-18 LAB — CBC
HCT: 46 % (ref 35.0–47.0)
Hemoglobin: 15.5 g/dL (ref 12.0–16.0)
MCH: 31.5 pg (ref 26.0–34.0)
MCHC: 33.8 g/dL (ref 32.0–36.0)
MCV: 93.3 fL (ref 80.0–100.0)
PLATELETS: 186 10*3/uL (ref 150–440)
RBC: 4.93 MIL/uL (ref 3.80–5.20)
RDW: 12.8 % (ref 11.5–14.5)
WBC: 9.4 10*3/uL (ref 3.6–11.0)

## 2015-09-18 LAB — TSH: TSH: 0.604 u[IU]/mL (ref 0.350–4.500)

## 2015-09-18 LAB — T4, FREE: FREE T4: 1.21 ng/dL — AB (ref 0.61–1.12)

## 2015-09-18 LAB — ETHANOL

## 2015-09-18 LAB — SALICYLATE LEVEL

## 2015-09-18 NOTE — ED Notes (Signed)
[facility]  PAPERS  RESCINDED  PER  DR  CLAPACS 

## 2015-09-18 NOTE — Discharge Instructions (Signed)
Suicidal Feelings: How to Help Yourself  Suicide is the taking of one's own life. If you feel as though life is getting too tough to handle and are thinking about suicide, get help right away. To get help:   Call your local emergency services (911 in the [place]   Call a suicide hotline to speak with a trained counselor who understands how you are feeling. The following is a list of suicide hotlines in the [place]. For a list of hotlines in [place], visit www.suicide.org/hotlines/international/[place]-suicide-hotlines.html.    1-800-273-TALK (1-800-273-8255).    1-800-SUICIDE (1-800-784-2433).    1-888-628-9454. This is a hotline for Spanish speakers.    1-800-799-4TTY (1-800-799-4889). This is a hotline for TTY users.    1-866-4-U-TREVOR (1-866-488-7386). This is a hotline for lesbian, gay, bisexual, transgender, or questioning youth.   Contact a crisis center or a local suicide prevention center. To find a crisis center or suicide prevention center:    Call your local hospital, clinic, community service organization, mental health center, social service provider, or health department. Ask for assistance in connecting to a crisis center.    Visit www.suicidepreventionlifeline.org/getinvolved/locator for a list of crisis centers in the [place], or visit www.suicideprevention.ca/thinking-about-suicide/find-a-crisis-centre for a list of centers in [place].   Visit the following websites:    National Suicide Prevention Lifeline: www.suicidepreventionlifeline.org    Hopeline: www.hopeline.com    American Foundation for Suicide Prevention: www.afsp.org    The Trevor Project (for lesbian, gay, bisexual, transgender, or questioning youth): www.thetrevorproject.org  HOW CAN I HELP MYSELF FEEL BETTER?   Promise yourself that you will not do anything drastic when you have suicidal feelings. Remember, there is hope. Many people have gotten through suicidal thoughts and feelings, and you will, too. You may  have gotten through them before, and this proves that you can get through them again.   Let family, friends, teachers, or counselors know how you are feeling. Try not to isolate yourself from those who care about you. Remember, they will want to help you. Talk with someone every day, even if you do not feel sociable. Face-to-face conversation is best.   Call a mental health professional and see one regularly.   Visit your primary health care provider every year.   Eat a well-balanced diet, and space your meals so you eat regularly.   Get plenty of rest.   Avoid alcohol and drugs, and remove them from your home. They will only make you feel worse.   If you are thinking of taking a lot of medicine, give your medicine to someone who can give it to you one day at a time. If you are on antidepressants and are concerned you will overdose, let your health care provider know so he or she can give you safer medicines. Ask your mental health professional about the possible side effects of any medicines you are taking.   Remove weapons, poisons, knives, and anything else that could harm you from your home.   Try to stick to routines. Follow a schedule every day. Put self-care on your schedule.   Make a list of realistic goals, and cross them off when you achieve them. Accomplishments give a sense of worth.   Wait until you are feeling better before doing the things you find difficult or unpleasant.   Exercise if you are able. You will feel better if you exercise for even a half hour each day.   Go out in the sun or into nature. This will help you   recover from depression faster. If you have a favorite place to walk, go there.   Do the things that have always given you pleasure. Play your favorite music, read a good book, paint a picture, play your favorite instrument, or do anything else that takes your mind off your depression if it is safe to do.   Keep your living space well lit.   When you are feeling well,  write yourself a letter about tips and support that you can read when you are not feeling well.   Remember that life's difficulties can be sorted out with help. Conditions can be treated. You can work on thoughts and strategies that serve you well.     This information is not intended to replace advice given to you by your health care provider. Make sure you discuss any questions you have with your health care provider.     Document Released: 01/26/2003 Document Revised: 08/12/2014 Document Reviewed: 11/16/2013  Elsevier Interactive Patient Education 2016 Elsevier Inc.    Stress and Stress Management  Stress is a normal reaction to life events. It is what you feel when life demands more than you are used to or more than you can handle. Some stress can be useful. For example, the stress reaction can help you catch the last bus of the day, study for a test, or meet a deadline at work. But stress that occurs too often or for too long can cause problems. It can affect your emotional health and interfere with relationships and normal daily activities. Too much stress can weaken your immune system and increase your risk for physical illness. If you already have a medical problem, stress can make it worse.  CAUSES   All sorts of life events may cause stress. An event that causes stress for one person may not be stressful for another person. Major life events commonly cause stress. These may be positive or negative. Examples include losing your job, moving into a new home, getting married, having a baby, or losing a loved one. Less obvious life events may also cause stress, especially if they occur day after day or in combination. Examples include working long hours, driving in traffic, caring for children, being in debt, or being in a difficult relationship.  SIGNS AND SYMPTOMS  Stress may cause emotional symptoms including, the following:   Anxiety. This is feeling worried, afraid, on edge, overwhelmed, or out of  control.   Anger. This is feeling irritated or impatient.   Depression. This is feeling sad, down, helpless, or guilty.   Difficulty focusing, remembering, or making decisions.  Stress may cause physical symptoms, including the following:    Aches and pains. These may affect your head, neck, back, stomach, or other areas of your body.   Tight muscles or clenched jaw.   Low energy or trouble sleeping.  Stress may cause unhealthy behaviors, including the following:    Eating to feel better (overeating) or skipping meals.   Sleeping too little, too much, or both.   Working too much or putting off tasks (procrastination).   Smoking, drinking alcohol, or using drugs to feel better.  DIAGNOSIS   Stress is diagnosed through an assessment by your health care provider. Your health care provider will ask questions about your symptoms and any stressful life events.Your health care provider will also ask about your medical history and may order blood tests or other tests. Certain medical conditions and medicine can cause physical symptoms similar to stress. Mental   stress for you. These skills may help you to avoid some stressful events.  Time management. Set your priorities, keep a calendar of events, and learn to say "no." These tools can help you avoid making too many commitments. Techniques for coping with stress include the following:  Rethinking the problem. Try to think realistically about stressful events rather  than ignoring them or overreacting. Try to find the positives in a stressful situation rather than focusing on the negatives.  Exercise. Physical exercise can release both physical and emotional tension. The key is to find a form of exercise you enjoy and do it regularly.  Relaxation techniques. These relax the body and mind. Examples include yoga, meditation, tai chi, biofeedback, deep breathing, progressive muscle relaxation, listening to music, being out in nature, journaling, and other hobbies. Again, the key is to find one or more that you enjoy and can do regularly.  Healthy lifestyle. Eat a balanced diet, get plenty of sleep, and do not smoke. Avoid using alcohol or drugs to relax.  Strong support network. Spend time with family, friends, or other people you enjoy being around.Express your feelings and talk things over with someone you trust. Counseling or talktherapy with a mental health professional may be helpful if you are having difficulty managing stress on your own. Medicine is typically not recommended for the treatment of stress.Talk to your health care provider if you think you need medicine for symptoms of stress. HOME CARE INSTRUCTIONS  Keep all follow-up visits as directed by your health care provider.  Take all medicines as directed by your health care provider. SEEK MEDICAL CARE IF:  Your symptoms get worse or you start having new symptoms.  You feel overwhelmed by your problems and can no longer manage them on your own. SEEK IMMEDIATE MEDICAL CARE IF:  You feel like hurting yourself or someone else.   This information is not intended to replace advice given to you by your health care provider. Make sure you discuss any questions you have with your health care provider.   Document Released: 01/15/2001 Document Revised: 08/12/2014 Document Reviewed: 03/16/2013 Elsevier Interactive Patient Education [employer].

## 2015-09-18 NOTE — Consult Note (Signed)
[facility] Face-to-Face Psychiatry Consult   Reason for Consult:  Consult for this 25 year old woman brought in under involuntary commitment filed by her mother alleging suicidal and homicidal statements. Referring Physician:  Joni Fears Patient Identification: [name] MRN:  [id number] Principal Diagnosis: Adjustment disorder with anxiety Diagnosis:   Patient Active Problem List   Diagnosis Date Noted  . Suicidal ideation [R45.851] 09/18/2015  . Panic disorder [F41.0] 09/18/2015  . Adjustment disorder with anxiety [F43.22] 09/18/2015  . Rectus sheath hematoma [S30.1XXA] 02/28/2015  . S/P cesarean section [Z98.891] 02/22/2015  . Supervision of normal pregnancy [Z34.90] 02/16/2015  . DISORDER, DEPRESSIVE NEC [F32.9] 05/04/2007  . History of other specified conditions presenting hazards to health [Z91.89] 04/30/2007  . TOBACCO ABUSE [F17.200] 12/31/2006  . MIGRAINE-COMMON [J49.702] 10/09/2006  . PMS [N94.3] 10/02/2006    Total Time spent with patient: 1 hour  Subjective:   [name] is a 25 y.o. female patient admitted with "my anxiety is just been really bad".  HPI:  Patient interviewed. Chart reviewed. Labs reviewed. Paperwork reviewed. Case discussed with emergency room doctor. Patient had declined request to contact her mother. Commitment paperwork says that she had made statements about killing herself and about stabbing her mother. The patient absolutely denies all of that. She does say she is been very anxious for years and years but for the past week or so it has been much worse. She feels constantly like she has someone standing on her chest. Her biggest stress sounds like his financial trouble with the potential of being evicted from where she is living. Her sleep nevertheless is been mostly adequate. She says she has been eating normally. She says she gets a little bit down at times but is not consistently depressed. She denies suicidal thoughts. Denies homicidal thoughts.  She speculates that her mother simply filed the commitment because she wanted her to get some kind of help. The patient denies that she's been drinking and denies any drug use. Not currently getting any treatment for anxiety or other mental health problems.  Social history: Patient just got out of a bad relationship. She is living by herself with 4 young children all under the ages of 88. Mother helps her part time. Patient is currently not employed. Major financial problems.  Medical history: She has asthma. Other than that no other clear ongoing medical problems.  Substance abuse history: Says she drinks very rarely never has had a problem with it. Denies any other drug use. Drug screen not back yet. No history identified of substance abuse.    Past Psychiatric History: Psychiatric history is that the patient says as an adolescent or younger she had counseling for anxiety. When she was pregnant her OB/GYN prescribed Xanax for her. After that the supply of Xanax stopped and she has not gone back to see a doctor or any other provider. She says she has never been prescribed any medicine other than Xanax or Klonopin in the past for anxiety. Denies any history of suicide attempts or violence. Denies ever being admitted to a psychiatric hospital.  Risk to Self: Is patient at risk for suicide?: Yes Risk to Others:   Prior Inpatient Therapy:   Prior Outpatient Therapy:    Past Medical History:  Past Medical History  Diagnosis Date  . Anxiety   . Asthma   . Chlamydia   . Enlarged thyroid   . Headache   . UTI (urinary tract infection) during pregnancy   . Anemia  Past Surgical History  Procedure Laterality Date  . Cesarean section  2010  . Tonsillectomy  2009  . Dilation and evacuation  04/01/2011    Procedure: DILATATION AND EVACUATION (D&E);  Surgeon: Marcial Pacas;  Location: [facility];  Service: Gynecology;  Laterality: N/A;  . Dilatation and curretage    . Tympanostomy tube placement     . Cesarean section with bilateral tubal ligation Bilateral 02/22/2015    Procedure: CESAREAN SECTION WITH BILATERAL TUBAL LIGATION;  Surgeon: Mora Bellman, MD;  Location: [facility];  Service: Obstetrics;  Laterality: Bilateral;   Family History:  Family History  Problem Relation Age of Onset  . Depression Mother   . Hyperlipidemia Mother   . Diabetes Mother   . Anesthesia problems Neg Hx    Family Psychiatric  History: Patient states that her mother and her grandmother both have bad anxiety problems and mood instability with possible suicide attempts Social History:  History  Alcohol Use  . Yes     History  Drug Use No    Social History   Social History  . Marital Status: Single    Spouse Name: N/A  . Number of Children: N/A  . Years of Education: N/A   Social History Main Topics  . Smoking status: Current Every Day Smoker -- 0.25 packs/day for 3 years    Types: Cigarettes  . Smokeless tobacco: Never Used  . Alcohol Use: Yes  . Drug Use: No  . Sexual Activity: Yes    Birth Control/ Protection: None   Other Topics Concern  . None   Social History Narrative   Additional Social History:    Allergies:  No Known Allergies  Labs:  Results for orders placed or performed during the hospital encounter of 09/18/15 (from the past 48 hour(s))  Comprehensive metabolic panel     Status: None   Collection Time: 09/18/15  4:38 PM  Result Value Ref Range   Sodium 139 135 - 145 mmol/L   Potassium 4.4 3.5 - 5.1 mmol/L   Chloride 104 101 - 111 mmol/L   CO2 27 22 - 32 mmol/L   Glucose, Bld 91 65 - 99 mg/dL   BUN 12 6 - 20 mg/dL   Creatinine, Ser 0.81 0.44 - 1.00 mg/dL   Calcium 9.2 8.9 - 10.3 mg/dL   Total Protein 7.6 6.5 - 8.1 g/dL   Albumin 4.8 3.5 - 5.0 g/dL   AST 16 15 - 41 U/L   ALT 17 14 - 54 U/L   Alkaline Phosphatase 71 38 - 126 U/L   Total Bilirubin 0.8 0.3 - 1.2 mg/dL   GFR calc non Af Amer >60 >60 mL/min   GFR calc Af Amer >60 >60 mL/min    Comment:  (NOTE) The eGFR has been calculated using the CKD EPI equation. This calculation has not been validated in all clinical situations. eGFR's persistently <60 mL/min signify possible Chronic Kidney Disease.    Anion gap 8 5 - 15  Ethanol (ETOH)     Status: None   Collection Time: 09/18/15  4:38 PM  Result Value Ref Range   Alcohol, Ethyl (B) <5 <5 mg/dL    Comment:        LOWEST DETECTABLE LIMIT FOR SERUM ALCOHOL IS 5 mg/dL FOR MEDICAL PURPOSES ONLY   Salicylate level     Status: None   Collection Time: 09/18/15  4:38 PM  Result Value Ref Range   Salicylate Lvl <6.3 2.8 - 30.0 mg/dL  Acetaminophen  level     Status: Abnormal   Collection Time: 09/18/15  4:38 PM  Result Value Ref Range   Acetaminophen (Tylenol), Serum <10 (L) 10 - 30 ug/mL    Comment:        THERAPEUTIC CONCENTRATIONS VARY SIGNIFICANTLY. A RANGE OF 10-30 ug/mL MAY BE AN EFFECTIVE CONCENTRATION FOR MANY PATIENTS. HOWEVER, SOME ARE BEST TREATED AT CONCENTRATIONS OUTSIDE THIS RANGE. ACETAMINOPHEN CONCENTRATIONS >150 ug/mL AT 4 HOURS AFTER INGESTION AND >50 ug/mL AT 12 HOURS AFTER INGESTION ARE OFTEN ASSOCIATED WITH TOXIC REACTIONS.   CBC     Status: None   Collection Time: 09/18/15  4:38 PM  Result Value Ref Range   WBC 9.4 3.6 - 11.0 K/uL   RBC 4.93 3.80 - 5.20 MIL/uL   Hemoglobin 15.5 12.0 - 16.0 g/dL   HCT 46.0 35.0 - 47.0 %   MCV 93.3 80.0 - 100.0 fL   MCH 31.5 26.0 - 34.0 pg   MCHC 33.8 32.0 - 36.0 g/dL   RDW 12.8 11.5 - 14.5 %   Platelets 186 150 - 440 K/uL    No current facility-administered medications for this encounter.   Current Outpatient Prescriptions  Medication Sig Dispense Refill  . acetaminophen (TYLENOL) 500 MG tablet Take 500 mg by mouth every 6 (six) hours as needed for mild pain or headache.     . albuterol (PROVENTIL HFA;VENTOLIN HFA) 108 (90 BASE) MCG/ACT inhaler Inhale 1-2 puffs into the lungs every 6 (six) hours as needed for wheezing. 1 Inhaler 0  . albuterol (PROVENTIL)  (2.5 MG/3ML) 0.083% nebulizer solution Inhale 3 mLs into the lungs every 6 (six) hours as needed for wheezing. 75 mL 12  . ferrous sulfate (FERROUSUL) 325 (65 FE) MG tablet Take 1 tablet (325 mg total) by mouth 2 (two) times daily. (Patient not taking: Reported on 02/28/2015) 60 tablet 1  . ibuprofen (ADVIL,MOTRIN) 600 MG tablet Take 1 tablet (600 mg total) by mouth every 6 (six) hours as needed. 30 tablet 1  . nitrofurantoin, macrocrystal-monohydrate, (MACROBID) 100 MG capsule Take 100 mg by mouth 2 (two) times daily.    Marland Kitchen OVER THE COUNTER MEDICATION Take 1 tablet by mouth 3 (three) times daily. Herbal vitamin for breast feeding    . oxyCODONE-acetaminophen (PERCOCET/ROXICET) 5-325 MG per tablet Take 1-2 tablets by mouth every 6 (six) hours as needed. 30 tablet 0  . polyethylene glycol (MIRALAX / GLYCOLAX) packet Take 17 g by mouth 2 (two) times daily as needed. (Patient taking differently: Take 17 g by mouth 2 (two) times daily as needed for mild constipation. ) 14 each 3  . prenatal vitamin w/FE, FA (PRENATAL 1 + 1) 27-1 MG TABS tablet Take 1 tablet by mouth daily at 12 noon.    . [DISCONTINUED] diphenhydrAMINE (BENADRYL) 25 MG tablet Take 1 tablet (25 mg total) by mouth every 6 (six) hours as needed for itching. 20 tablet 0    Musculoskeletal: Strength & Muscle Tone: within normal limits Gait & Station: normal Patient leans: N/A  Psychiatric Specialty Exam: Review of Systems  Constitutional: Negative.   HENT: Negative.   Eyes: Negative.   Respiratory: Negative.   Cardiovascular: Negative.   Gastrointestinal: Negative.   Musculoskeletal: Negative.   Skin: Negative.   Neurological: Negative.   Psychiatric/Behavioral: Negative for depression, suicidal ideas, hallucinations, memory loss and substance abuse. The patient is nervous/anxious. The patient does not have insomnia.     Blood pressure 117/75, pulse 76, temperature 97.4 F (36.3 C), temperature source Oral, resp. rate  16, weight  54.432 kg (120 lb), last menstrual period 09/12/2015, SpO2 100 %, currently breastfeeding.Body mass index is 21.26 kg/(m^2).  General Appearance: Casual  Eye Contact::  Fair  Speech:  Slow  Volume:  Decreased  Mood:  Dysphoric  Affect:  Congruent  Thought Process:  Goal Directed  Orientation:  Full (Time, Place, and Person)  Thought Content:  Negative  Suicidal Thoughts:  No  Homicidal Thoughts:  No  Memory:  Immediate;   Good Recent;   Poor Remote;   Fair  Judgement:  Fair  Insight:  Fair  Psychomotor Activity:  Decreased  Concentration:  Fair  Recall:  [employer] of Knowledge:Fair  Language: Fair  Akathisia:  No  Handed:  Right  AIMS (if indicated):     Assets:  Housing Physical Health Resilience  ADL's:  Intact  Cognition: WNL  Sleep:      Treatment Plan Summary: Plan This is a 25 year old woman who convincingly says that she has not had any suicidal thoughts or homicidal thoughts. The information on the commitment is very vague and we don't have any back up to it. We don't have any evidence of any concern about dangerousness in the past. Drug screen is not back but the patient does not appear to be intoxicated or agitated. I think that at this point she does not seem to be at elevated risk to herself or others. Case reviewed with the ER physician. I nevertheless gave the patient the opportunity to be admitted to the psychiatric hospital if she thought that it would be useful for her but she declines. She states that she will go to follow-up with outpatient treatment. Case reviewed with TTS and she will be given information about [facility] and strongly encouraged to follow-up with therapy and possible medication management. I would not like to start any antianxiety medicine at this point. I don't think would be appropriate to send her home with benzodiazepines and I think that any other medicine would require close monitoring. IVC discontinued.  Disposition: Patient does not meet  criteria for psychiatric inpatient admission. Supportive therapy provided about ongoing stressors. Discussed crisis plan, support from social network, calling 911, coming to the Emergency Department, and calling Suicide Hotline.  Alethia Berthold, MD 09/18/2015 6:34 PM

## 2015-09-18 NOTE — ED Provider Notes (Signed)
[facility] Emergency Department Provider Note  ____________________________________________  Time seen: 4:45 PM  I have reviewed the triage vital signs and the nursing notes.   HISTORY  Chief Complaint Suicidal and Homicidal    HPI [name] is a 25 y.o. female brought to the ED under IVC that was initiated by her mother. IVC states that the patient was stating she was going to kill himself and threatened to stab her mother with a knife. Patient reports that she's been very stressed recently due to her impending eviction from her apartment. She also reports a history of anxiety over the past 6 or 7 months ever since the birth of her son, and she is to get Xanax from the gynecologist to stop giving that to her. Today she was driving around for a few hours trying to process her thoughts, and when she returned home she got into a conflict with her mother who then called police and had her IV seat. Patient denies SI or HI or hallucinations.     Past Medical History  Diagnosis Date  . Anxiety   . Asthma   . Chlamydia   . Enlarged thyroid   . Headache   . UTI (urinary tract infection) during pregnancy   . Anemia      Patient Active Problem List   Diagnosis Date Noted  . Rectus sheath hematoma 02/28/2015  . S/P cesarean section 02/22/2015  . Supervision of normal pregnancy 02/16/2015  . DISORDER, DEPRESSIVE NEC 05/04/2007  . History of other specified conditions presenting hazards to health 04/30/2007  . TOBACCO ABUSE 12/31/2006  . MIGRAINE-COMMON 10/09/2006  . PMS 10/02/2006     Past Surgical History  Procedure Laterality Date  . Cesarean section  2010  . Tonsillectomy  2009  . Dilation and evacuation  04/01/2011    Procedure: DILATATION AND EVACUATION (D&E);  Surgeon: Almon Hercules;  Location: [facility];  Service: Gynecology;  Laterality: N/A;  . Dilatation and curretage    . Tympanostomy tube placement    . Cesarean section with bilateral  tubal ligation Bilateral 02/22/2015    Procedure: CESAREAN SECTION WITH BILATERAL TUBAL LIGATION;  Surgeon: Catalina Antigua, MD;  Location: [facility];  Service: Obstetrics;  Laterality: Bilateral;     Current Outpatient Rx  Name  Route  Sig  Dispense  Refill  . acetaminophen (TYLENOL) 500 MG tablet   Oral   Take 500 mg by mouth every 6 (six) hours as needed for mild pain or headache.          . albuterol (PROVENTIL HFA;VENTOLIN HFA) 108 (90 BASE) MCG/ACT inhaler   Inhalation   Inhale 1-2 puffs into the lungs every 6 (six) hours as needed for wheezing.   1 Inhaler   0   . albuterol (PROVENTIL) (2.5 MG/3ML) 0.083% nebulizer solution   Inhalation   Inhale 3 mLs into the lungs every 6 (six) hours as needed for wheezing.   75 mL   12   . ferrous sulfate (FERROUSUL) 325 (65 FE) MG tablet   Oral   Take 1 tablet (325 mg total) by mouth 2 (two) times daily. Patient not taking: Reported on 02/28/2015   60 tablet   1   . ibuprofen (ADVIL,MOTRIN) 600 MG tablet   Oral   Take 1 tablet (600 mg total) by mouth every 6 (six) hours as needed.   30 tablet   1   . nitrofurantoin, macrocrystal-monohydrate, (MACROBID) 100 MG capsule   Oral  Take 100 mg by mouth 2 (two) times daily.         Marland Kitchen OVER THE COUNTER MEDICATION   Oral   Take 1 tablet by mouth 3 (three) times daily. Herbal vitamin for breast feeding         . oxyCODONE-acetaminophen (PERCOCET/ROXICET) 5-325 MG per tablet   Oral   Take 1-2 tablets by mouth every 6 (six) hours as needed.   30 tablet   0   . polyethylene glycol (MIRALAX / GLYCOLAX) packet   Oral   Take 17 g by mouth 2 (two) times daily as needed. Patient taking differently: Take 17 g by mouth 2 (two) times daily as needed for mild constipation.    14 each   3   . prenatal vitamin w/FE, FA (PRENATAL 1 + 1) 27-1 MG TABS tablet   Oral   Take 1 tablet by mouth daily at 12 noon.            Allergies Review of patient's allergies indicates no known  allergies.   Family History  Problem Relation Age of Onset  . Depression Mother   . Hyperlipidemia Mother   . Diabetes Mother   . Anesthesia problems Neg Hx     Social History Social History  Substance Use Topics  . Smoking status: Current Every Day Smoker -- 0.25 packs/day for 3 years    Types: Cigarettes  . Smokeless tobacco: Never Used  . Alcohol Use: Yes    Review of Systems  Constitutional:   No fever or chills. No weight changes Eyes:   No blurry vision or double vision.  ENT:   No sore throat. Cardiovascular:   No chest pain. Respiratory:   No dyspnea or cough. Gastrointestinal:   Negative for abdominal pain, vomiting and diarrhea.  No BRBPR or melena. Genitourinary:   Negative for dysuria, urinary retention, bloody urine, or difficulty urinating. Musculoskeletal:   Negative for back pain. No joint swelling or pain. Skin:   Negative for rash. Neurological:   Negative for headaches, focal weakness or numbness. Psychiatric:  Positive anxiety, depressed mood.   Endocrine:  No hot/cold intolerance, changes in energy, or sleep difficulty.  10-point ROS otherwise negative.  ____________________________________________   PHYSICAL EXAM:  VITAL SIGNS: ED Triage Vitals  Enc Vitals Group     BP 09/18/15 1634 117/75 mmHg     Pulse Rate 09/18/15 1634 76     Resp 09/18/15 1634 16     Temp 09/18/15 1634 97.4 F (36.3 C)     Temp Source 09/18/15 1634 Oral     SpO2 09/18/15 1634 100 %     Weight 09/18/15 1634 120 lb (54.432 kg)     Height --      Head Cir --      Peak Flow --      Pain Score 09/18/15 1635 0     Pain Loc --      Pain Edu? --      Excl. in GC? --     Vital signs reviewed, nursing assessments reviewed.   Constitutional:   Alert and oriented. Well appearing and in no distress. Eyes:   No scleral icterus. No conjunctival pallor. PERRL. EOMI ENT   Head:   Normocephalic and atraumatic.   Nose:   No congestion/rhinnorhea. No septal  hematoma   Mouth/Throat:   MMM, no pharyngeal erythema. No peritonsillar mass. No uvula shift.   Neck:   No stridor. No SubQ emphysema. No meningismus. Hematological/Lymphatic/Immunilogical:  No cervical lymphadenopathy. Cardiovascular:   RRR. Normal and symmetric distal pulses are present in all extremities. No murmurs, rubs, or gallops. Respiratory:   Normal respiratory effort without tachypnea nor retractions. Breath sounds are clear and equal bilaterally. No wheezes/rales/rhonchi. Gastrointestinal:   Soft and nontender. No distention. There is no CVA tenderness.  No rebound, rigidity, or guarding. Genitourinary:   deferred Musculoskeletal:   Nontender with normal range of motion in all extremities. No joint effusions.  No lower extremity tenderness.  No edema. Neurologic:   Normal speech and language.  CN 2-10 normal. Motor grossly intact. No pronator drift.  Normal gait. No gross focal neurologic deficits are appreciated.  Skin:    Skin is warm, dry and intact. No rash noted.  No petechiae, purpura, or bullae. Psychiatric:   Depressed mood and affect. Tearful.. SpeechMarland Kitchen and behavior are normal. Patient exhibits appropriate insight and judgment.  ____________________________________________    LABS (pertinent positives/negatives) (all labs ordered are listed, but only abnormal results are displayed) Labs Reviewed  ACETAMINOPHEN LEVEL - Abnormal; Notable for the following:    Acetaminophen (Tylenol), Serum <10 (*)    All other components within normal limits  COMPREHENSIVE METABOLIC PANEL  ETHANOL  SALICYLATE LEVEL  CBC  T4, FREE  TSH  URINE DRUG SCREEN, QUALITATIVE ([facility] ONLY)   ____________________________________________   EKG    ____________________________________________    RADIOLOGY    ____________________________________________   PROCEDURES   ____________________________________________   INITIAL IMPRESSION / ASSESSMENT AND PLAN / ED  COURSE  Pertinent labs & imaging results that were available during my care of the patient were reviewed by me and considered in my medical decision making (see chart for details).  Patient presents with anxiety and depressed mood. Medically stable. Case discussed with psychiatrist Dr. Toni Amend after his evaluation in the ED, he feels the patient is psychiatrically stable. Additionally, the patient refused behavioral medicine admission was offered and prefers to follow up outpatient. We'll give her some resources so that she can facilitate her care, discharge home.     ____________________________________________   FINAL CLINICAL IMPRESSION(S) / ED DIAGNOSES  Final diagnoses:  Suicidal ideation  Recurrent major depressive disorder, remission status unspecified (HCC)      Sharman Cheek, MD 09/18/15 [phone]

## 2015-09-18 NOTE — ED Notes (Signed)
Pt changing, "I feel nauseous" from tanning too much

## 2015-09-18 NOTE — BH Assessment (Signed)
Assessment Note  [name] is an 25 y.o. female. Ms. Graf arrived to the ED by way of the [facility].  She states that she was having a lot of anxiety.  She states that she lost her job, is getting out of a 6 year relationship, getting evicted, and family concerns. She reports that she may have a mild depression, with problems falling asleep. She denied having auditory or visual hallucinations. She denied suicidal or homicidal ideation or intent.  She reports that the last 2 days were extremely overwhelming.  She states that "Everything was just mind boggling". She denied the use of alcohol or drugs.  Diagnosis:  Past Medical History:  Past Medical History  Diagnosis Date  . Anxiety   . Asthma   . Chlamydia   . Enlarged thyroid   . Headache   . UTI (urinary tract infection) during pregnancy   . Anemia     Past Surgical History  Procedure Laterality Date  . Cesarean section  2010  . Tonsillectomy  2009  . Dilation and evacuation  04/01/2011    Procedure: DILATATION AND EVACUATION (D&E);  Surgeon: Almon Hercules;  Location: [facility];  Service: Gynecology;  Laterality: N/A;  . Dilatation and curretage    . Tympanostomy tube placement    . Cesarean section with bilateral tubal ligation Bilateral 02/22/2015    Procedure: CESAREAN SECTION WITH BILATERAL TUBAL LIGATION;  Surgeon: Catalina Antigua, MD;  Location: [facility];  Service: Obstetrics;  Laterality: Bilateral;    Family History:  Family History  Problem Relation Age of Onset  . Depression Mother   . Hyperlipidemia Mother   . Diabetes Mother   . Anesthesia problems Neg Hx     Social History:  reports that she has been smoking Cigarettes.  She has a .75 pack-year smoking history. She has never used smokeless tobacco. She reports that she drinks alcohol. She reports that she does not use illicit drugs.  Additional Social History:  Alcohol / Drug Use History of alcohol / drug use?: No history of alcohol / drug  abuse  CIWA: CIWA-Ar BP: 117/75 mmHg Pulse Rate: 76 COWS:    Allergies: No Known Allergies  Home Medications:  (Not in a hospital admission)  OB/GYN Status:  Patient's last menstrual period was 09/12/2015 (approximate).  General Assessment Data Location of Assessment: [facility] ED TTS Assessment: In system Is this a Tele or Face-to-Face Assessment?: Face-to-Face Is this an Initial Assessment or a Re-assessment for this encounter?: Initial Assessment Marital status: Single Maiden name: n/a Is patient pregnant?: No Pregnancy Status: No Living Arrangements: Children Can pt return to current living arrangement?: Yes Admission Status: Involuntary Is patient capable of signing voluntary admission?: Yes Referral Source: Self/Family/Friend Insurance type: BCBS  Medical Screening Exam [facility] Walk-in ONLY) Medical Exam completed: Yes  Crisis Care Plan Living Arrangements: Children Legal Guardian: Other: (Self) Name of Psychiatrist: Denied  Name of Therapist: Denied  Education Status Is patient currently in school?: No Current Grade: n/a Highest grade of school patient has completed: GED Name of school: n/a Contact person: n/a  Risk to self with the past 6 months Suicidal Ideation: No Has patient been a risk to self within the past 6 months prior to admission? : No Suicidal Intent: No Has patient had any suicidal intent within the past 6 months prior to admission? : No Is patient at risk for suicide?: No Suicidal Plan?: No Has patient had any suicidal plan within the past 6 months prior  to admission? : No Access to Means: No What has been your use of drugs/alcohol within the last 12 months?: denied Previous Attempts/Gestures: No How many times?: 0 Other Self Harm Risks: denied Triggers for Past Attempts: None known Intentional Self Injurious Behavior: None Family Suicide History: No (family member attemtped, not successful) Recent stressful life event(s): Job Loss  (relationship break up, eviction from home) Persecutory voices/beliefs?: No Depression: Yes Depression Symptoms: Despondent, Insomnia Substance abuse history and/or treatment for substance abuse?: No Suicide prevention information given to non-admitted patients: Not applicable  Risk to Others within the past 6 months Homicidal Ideation: No Does patient have any lifetime risk of violence toward others beyond the six months prior to admission? : No Thoughts of Harm to Others: No Current Homicidal Intent: No Current Homicidal Plan: No Access to Homicidal Means: No Identified Victim: denied History of harm to others?: No Assessment of Violence: None Noted Violent Behavior Description: denied Does patient have access to weapons?: No Criminal Charges Pending?: No Does patient have a court date: No Is patient on probation?: No  Psychosis Hallucinations: None noted Delusions: None noted  Mental Status Report Appearance/Hygiene: In scrubs Eye Contact: Fair Motor Activity: Unremarkable Speech: Logical/coherent Level of Consciousness: Quiet/awake Mood: Depressed Affect: Anxious Anxiety Level: Moderate Thought Processes: Coherent Judgement: Unimpaired Orientation: Person, Place, Time, Situation Obsessive Compulsive Thoughts/Behaviors: Minimal  Cognitive Functioning Concentration: Normal Memory: Recent Intact IQ: Average Insight: Fair Impulse Control: Fair Sleep: Decreased Vegetative Symptoms: None  ADLScreening [facility] Assessment Services) Patient's cognitive ability adequate to safely complete daily activities?: Yes Patient able to express need for assistance with ADLs?: Yes Independently performs ADLs?: Yes (appropriate for developmental age)  Prior Inpatient Therapy Prior Inpatient Therapy: Yes Prior Therapy Dates: 2005 Prior Therapy Facilty/Provider(s): [facility] Reason for Treatment: anxiety  Prior Outpatient Therapy Prior Outpatient Therapy: Yes Prior Therapy Dates:  2006 Prior [facility] Facilty[facility]Provider([facility]): [facility] Reason for Treatment: anxiety Does patient have an ACCT team?: No Does patient have Intensive In-House Services?  : No Does patient have [facility] services? : No Does patient have P4CC services?: No  ADL Screening (condition at time of admission) Patient's cognitive ability adequate to safely complete daily activities?: Yes Patient able to express need for assistance with ADLs?: Yes Independently performs ADLs?: Yes (appropriate for developmental age)       Abuse/Neglect Assessment (Assessment to be complete while patient is alone) Physical Abuse: Denies Verbal Abuse: Denies Sexual Abuse: Denies Exploitation of patient/patient's resources: Denies Self-Neglect: Denies Values / Beliefs Cultural Requests During Hospitalization: None Spiritual Requests During Hospitalization: None   Advance Directives (For Healthcare) Does patient have an advance directive?: No Would patient like information on creating an advanced directive?: No - patient declined information    Additional Information 1:1 In Past 12 Months?: No CIRT Risk: No Elopement Risk: No Does patient have medical clearance?: Yes     Disposition:  Disposition Initial Assessment Completed for this Encounter: Yes Disposition of Patient: Outpatient treatment  On Site Evaluation by:   Reviewed with Physician:    Justice Deeds 09/18/2015 7:07 PM

## 2015-09-18 NOTE — ED Notes (Signed)
Psych MD at bedside

## 2015-09-18 NOTE — ED Notes (Signed)
Pt does not want mother to know any information.

## 2015-09-18 NOTE — ED Notes (Addendum)
Mother called to get information on patient, informed mother she'd have to come to ED to get information if permitted by patient; this RN was hung up on.

## 2015-09-18 NOTE — ED Notes (Signed)
Pt here IVC by [employer], papers state pt with SI and HI towards mother. Pt denies hallucinations, denies alcohol or drug use today.

## 2015-09-26 ENCOUNTER — Encounter: Payer: Self-pay | Admitting: *Deleted

## 2015-09-28 ENCOUNTER — Encounter: Payer: Self-pay | Admitting: Emergency Medicine

## 2015-09-28 ENCOUNTER — Emergency Department
Admission: EM | Admit: 2015-09-28 | Discharge: 2015-09-28 | Disposition: A | Discharge status: Home / Self Care | Payer: BLUE CROSS/BLUE SHIELD | Attending: Emergency Medicine | Admitting: Emergency Medicine

## 2015-09-28 DIAGNOSIS — N760 Acute vaginitis: Secondary | ICD-10-CM | POA: Insufficient documentation

## 2015-09-28 DIAGNOSIS — Z3202 Encounter for pregnancy test, result negative: Secondary | ICD-10-CM | POA: Insufficient documentation

## 2015-09-28 DIAGNOSIS — F17210 Nicotine dependence, cigarettes, uncomplicated: Secondary | ICD-10-CM | POA: Diagnosis not present

## 2015-09-28 DIAGNOSIS — B9689 Other specified bacterial agents as the cause of diseases classified elsewhere: Secondary | ICD-10-CM

## 2015-09-28 DIAGNOSIS — N898 Other specified noninflammatory disorders of vagina: Secondary | ICD-10-CM | POA: Diagnosis present

## 2015-09-28 LAB — URINALYSIS COMPLETE WITH MICROSCOPIC (ARMC ONLY)
BACTERIA UA: NONE SEEN
Bilirubin Urine: NEGATIVE
Glucose, UA: NEGATIVE mg/dL
Ketones, ur: NEGATIVE mg/dL
LEUKOCYTES UA: NEGATIVE
NITRITE: NEGATIVE
PROTEIN: NEGATIVE mg/dL
SPECIFIC GRAVITY, URINE: 1.015 (ref 1.005–1.030)
pH: 5 (ref 5.0–8.0)

## 2015-09-28 LAB — CBC
HEMATOCRIT: 40.1 % (ref 35.0–47.0)
HEMOGLOBIN: 13.7 g/dL (ref 12.0–16.0)
MCH: 31.9 pg (ref 26.0–34.0)
MCHC: 34.2 g/dL (ref 32.0–36.0)
MCV: 93.3 fL (ref 80.0–100.0)
Platelets: 219 10*3/uL (ref 150–440)
RBC: 4.3 MIL/uL (ref 3.80–5.20)
RDW: 12.8 % (ref 11.5–14.5)
WBC: 8.7 10*3/uL (ref 3.6–11.0)

## 2015-09-28 LAB — COMPREHENSIVE METABOLIC PANEL
ALBUMIN: 3.9 g/dL (ref 3.5–5.0)
ALK PHOS: 51 U/L (ref 38–126)
ALT: 14 U/L (ref 14–54)
AST: 17 U/L (ref 15–41)
Anion gap: 8 (ref 5–15)
BUN: 12 mg/dL (ref 6–20)
CO2: 23 mmol/L (ref 22–32)
Calcium: 8.8 mg/dL — ABNORMAL LOW (ref 8.9–10.3)
Chloride: 108 mmol/L (ref 101–111)
Creatinine, Ser: 0.71 mg/dL (ref 0.44–1.00)
GFR calc Af Amer: >60 mL/min (ref >60)
GLUCOSE: 91 mg/dL (ref 65–99)
Potassium: 4.4 mmol/L (ref 3.5–5.1)
Sodium: 139 mmol/L (ref 135–145)
TOTAL PROTEIN: 6.4 g/dL — AB (ref 6.5–8.1)

## 2015-09-28 LAB — CHLAMYDIA/NGC RT PCR (ARMC ONLY)
Chlamydia Tr: NOT DETECTED
N gonorrhoeae: NOT DETECTED

## 2015-09-28 LAB — WET PREP, GENITAL
SPERM: NONE SEEN
Trich, Wet Prep: NONE SEEN
Yeast Wet Prep HPF POC: NONE SEEN

## 2015-09-28 LAB — POCT PREGNANCY, URINE: PREG TEST UR: NEGATIVE

## 2015-09-28 LAB — RAPID HIV SCREEN (HIV 1/2 AB+AG)
HIV 1/2 ANTIBODIES: NONREACTIVE
HIV-1 P24 Antigen - HIV24: NONREACTIVE

## 2015-09-28 MED ORDER — AZITHROMYCIN 250 MG PO TABS
1000.0000 mg | ORAL_TABLET | Freq: Once | ORAL | Status: AC
  Administered 2015-09-28: 1000 mg via ORAL

## 2015-09-28 MED ORDER — AZITHROMYCIN 250 MG PO TABS
ORAL_TABLET | ORAL | Status: AC
  Administered 2015-09-28: 1000 mg via ORAL
  Filled 2015-09-28: qty 4

## 2015-09-28 MED ORDER — CEFTRIAXONE SODIUM 250 MG IJ SOLR
INTRAMUSCULAR | Status: AC
  Filled 2015-09-28: qty 250

## 2015-09-28 MED ORDER — METRONIDAZOLE 500 MG PO TABS: 500.0000 mg | ORAL_TABLET | Freq: Two times a day (BID) | ORAL | Status: DC

## 2015-09-28 MED ORDER — CEFTRIAXONE SODIUM 250 MG IJ SOLR
250.0000 mg | INTRAMUSCULAR | Status: DC
  Administered 2015-09-28: 250 mg via INTRAMUSCULAR

## 2015-09-28 NOTE — ED Notes (Signed)
Patient presents to the ED with lower abdominal pain x 4 days with unusual smelling vaginal discharge.  Patient states she had a baby in July and hasn't been checked for STDs since that time.  Patient is in no obvious distress at this time.

## 2015-09-28 NOTE — ED Provider Notes (Signed)
[facility] Emergency Department Provider Note     Time seen: ----------------------------------------- 2:57 PM on 09/28/2015 -----------------------------------------    I have reviewed the triage vital signs and the nursing notes.   HISTORY  Chief Complaint Abdominal Pain and Vaginal Discharge    HPI [name] is a 25 y.o. female who presents to ER for lower abdominal pain for last 4 days with unusual "trashing" smelling vaginal discharge. Patient states she had a baby in July and hasn't been checked for STDs since that period time. Patient also is wondering if this is from anal sex. She states she has not been checked for HIV since the birth of her son. She denies any complaints other than lower abdominal pain and foul smelling vaginal discharge.   Past Medical History  Diagnosis Date  . Anxiety   . Asthma   . Chlamydia   . Enlarged thyroid   . Headache   . UTI (urinary tract infection) during pregnancy   . Anemia     Patient Active Problem List   Diagnosis Date Noted  . Suicidal ideation 09/18/2015  . Panic disorder 09/18/2015  . Adjustment disorder with anxiety 09/18/2015  . Rectus sheath hematoma 02/28/2015  . S/P cesarean section 02/22/2015  . Supervision of normal pregnancy 02/16/2015  . DISORDER, DEPRESSIVE NEC 05/04/2007  . History of other specified conditions presenting hazards to health 04/30/2007  . TOBACCO ABUSE 12/31/2006  . MIGRAINE-COMMON 10/09/2006  . PMS 10/02/2006    Past Surgical History  Procedure Laterality Date  . Cesarean section  2010  . Tonsillectomy  2009  . Dilation and evacuation  04/01/2011    Procedure: DILATATION AND EVACUATION (D&E);  Surgeon: Almon Hercules;  Location: [facility];  Service: Gynecology;  Laterality: N/A;  . Dilatation and curretage    . Tympanostomy tube placement    . Cesarean section with bilateral tubal ligation Bilateral 02/22/2015    Procedure: CESAREAN SECTION WITH BILATERAL  TUBAL LIGATION;  Surgeon: Catalina Antigua, MD;  Location: [facility];  Service: Obstetrics;  Laterality: Bilateral;    Allergies Review of patient's allergies indicates no known allergies.  Social History Social History  Substance Use Topics  . Smoking status: Current Every Day Smoker -- 0.25 packs/day for 3 years    Types: Cigarettes  . Smokeless tobacco: Never Used  . Alcohol Use: Yes    Review of Systems Constitutional: Negative for fever. Eyes: Negative for visual changes. ENT: Negative for sore throat. Cardiovascular: Negative for chest pain. Respiratory: Negative for shortness of breath. Gastrointestinal: Positive for abdominal pain, negative for vomiting and diarrhea Genitourinary: Negative for dysuria. Positive vaginal discharge Musculoskeletal: Negative for back pain. Skin: Negative for rash. Neurological: Negative for headaches, focal weakness or numbness.  10-point ROS otherwise negative.  ____________________________________________   PHYSICAL EXAM:  VITAL SIGNS: ED Triage Vitals  Enc Vitals Group     BP 09/28/15 1227 107/58 mmHg     Pulse Rate 09/28/15 1227 65     Resp 09/28/15 1227 16     Temp 09/28/15 1227 98.3 F (36.8 C)     Temp Source 09/28/15 1227 Oral     SpO2 09/28/15 1227 97 %     Weight 09/28/15 1227 110 lb (49.896 kg)     Height 09/28/15 1227  (1.6 m)     Head Cir --      Peak Flow --      Pain Score 09/28/15 1219 5     Pain Loc --  Pain Edu? --      Excl. in GC? --     Constitutional: Alert and oriented. Well appearing and in no distress. Eyes: Conjunctivae are normal. PERRL. Normal extraocular movements. ENT   Head: Normocephalic and atraumatic.   Nose: No congestion/rhinnorhea.   Mouth/Throat: Mucous membranes are moist.   Neck: No stridor. Cardiovascular: Normal rate, regular rhythm. Normal and symmetric distal pulses are present in all extremities. No murmurs, rubs, or gallops. Respiratory: Normal respiratory  effort without tachypnea nor retractions. Breath sounds are clear and equal bilaterally. No wheezes/rales/rhonchi. Gastrointestinal: Soft and nontender. No distention. No abdominal bruits.  Genitourinary: Clear vaginal discharge, scant all blood is noted. Minimal cervical motion tenderness. Musculoskeletal: Nontender with normal range of motion in all extremities. No joint effusions.  No lower extremity tenderness nor edema. Neurologic:  Normal speech and language. No gross focal neurologic deficits are appreciated. Speech is normal. No gait instability. Skin:  Skin is warm, dry and intact. No rash noted. Psychiatric: Mood and affect are normal. Speech and behavior are normal. Patient exhibits appropriate insight and judgment. ____________________________________________  ED COURSE:  Pertinent labs & imaging results that were available during my care of the patient were reviewed by me and considered in my medical decision making (see chart for details). Patient's no acute distress, will obtain basic labs and reevaluate. ____________________________________________    LABS (pertinent positives/negatives)  Labs Reviewed  WET PREP, GENITAL - Abnormal; Notable for the following:    Clue Cells Wet Prep HPF POC PRESENT (*)    WBC, Wet Prep HPF POC FEW (*)    All other components within normal limits  COMPREHENSIVE METABOLIC PANEL - Abnormal; Notable for the following:    Calcium 8.8 (*)    Total Protein 6.4 (*)    Total Bilirubin <0.1 (*)    All other components within normal limits  URINALYSIS COMPLETEWITH MICROSCOPIC ([facility] ONLY) - Abnormal; Notable for the following:    Color, Urine YELLOW (*)    APPearance CLEAR (*)    Hgb urine dipstick 2+ (*)    Squamous Epithelial / LPF 0-5 (*)    All other components within normal limits  CHLAMYDIA/NGC RT PCR ([facility] ONLY)  CBC  RAPID HIV SCREEN (HIV 1/2 AB+AG)  POC URINE PREG, ED  POCT PREGNANCY, URINE   ____________________________________________  FINAL ASSESSMENT AND PLAN  Bacterial vaginosis  Plan: Patient with labs as dictated above. Pelvic exam was not concerning for STD, she'll be given Flagyl twice a day for her bacterial vaginosis. She is stable for discharge.   Emily Filbert, MD   Emily Filbert, MD 09/28/15 [phone]

## 2015-09-28 NOTE — Discharge Instructions (Signed)

## 2016-03-08 ENCOUNTER — Encounter: Payer: Self-pay | Admitting: Emergency Medicine

## 2016-03-08 ENCOUNTER — Emergency Department
Admission: EM | Admit: 2016-03-08 | Discharge: 2016-03-08 | Disposition: A | Discharge status: Home / Self Care | Payer: BLUE CROSS/BLUE SHIELD | Attending: Emergency Medicine | Admitting: Emergency Medicine

## 2016-03-08 DIAGNOSIS — N760 Acute vaginitis: Secondary | ICD-10-CM | POA: Insufficient documentation

## 2016-03-08 DIAGNOSIS — F419 Anxiety disorder, unspecified: Secondary | ICD-10-CM | POA: Diagnosis not present

## 2016-03-08 DIAGNOSIS — B9689 Other specified bacterial agents as the cause of diseases classified elsewhere: Secondary | ICD-10-CM

## 2016-03-08 DIAGNOSIS — E0590 Thyrotoxicosis, unspecified without thyrotoxic crisis or storm: Secondary | ICD-10-CM | POA: Diagnosis not present

## 2016-03-08 DIAGNOSIS — F17210 Nicotine dependence, cigarettes, uncomplicated: Secondary | ICD-10-CM | POA: Diagnosis not present

## 2016-03-08 DIAGNOSIS — N898 Other specified noninflammatory disorders of vagina: Secondary | ICD-10-CM | POA: Diagnosis present

## 2016-03-08 LAB — URINALYSIS COMPLETE WITH MICROSCOPIC (ARMC ONLY)
BILIRUBIN URINE: NEGATIVE
Bacteria, UA: NONE SEEN
GLUCOSE, UA: NEGATIVE mg/dL
Ketones, ur: NEGATIVE mg/dL
LEUKOCYTES UA: NEGATIVE
Nitrite: NEGATIVE
Protein, ur: NEGATIVE mg/dL
Specific Gravity, Urine: 1.024 (ref 1.005–1.030)
WBC, UA: NONE SEEN WBC/hpf (ref 0–5)
pH: 5 (ref 5.0–8.0)

## 2016-03-08 LAB — WET PREP, GENITAL
Sperm: NONE SEEN
Trich, Wet Prep: NONE SEEN
WBC WET PREP: NONE SEEN
YEAST WET PREP: NONE SEEN

## 2016-03-08 LAB — COMPREHENSIVE METABOLIC PANEL
ALT: 16 U/L (ref 14–54)
AST: 14 U/L — AB (ref 15–41)
Albumin: 4.3 g/dL (ref 3.5–5.0)
Alkaline Phosphatase: 58 U/L (ref 38–126)
Anion gap: 8 (ref 5–15)
BILIRUBIN TOTAL: 0.6 mg/dL (ref 0.3–1.2)
BUN: 16 mg/dL (ref 6–20)
CO2: 24 mmol/L (ref 22–32)
CREATININE: 0.92 mg/dL (ref 0.44–1.00)
Calcium: 8.6 mg/dL — ABNORMAL LOW (ref 8.9–10.3)
Chloride: 107 mmol/L (ref 101–111)
GFR calc Af Amer: >60 mL/min (ref >60)
GLUCOSE: 131 mg/dL — AB (ref 65–99)
Potassium: 3.7 mmol/L (ref 3.5–5.1)
Sodium: 139 mmol/L (ref 135–145)
TOTAL PROTEIN: 6.6 g/dL (ref 6.5–8.1)

## 2016-03-08 LAB — CBC
HCT: 41.6 % (ref 35.0–47.0)
Hemoglobin: 14.7 g/dL (ref 12.0–16.0)
MCH: 33.3 pg (ref 26.0–34.0)
MCHC: 35.4 g/dL (ref 32.0–36.0)
MCV: 94 fL (ref 80.0–100.0)
PLATELETS: 185 10*3/uL (ref 150–440)
RBC: 4.42 MIL/uL (ref 3.80–5.20)
RDW: 12.5 % (ref 11.5–14.5)
WBC: 9.5 10*3/uL (ref 3.6–11.0)

## 2016-03-08 LAB — LIPASE, BLOOD: Lipase: 20 U/L (ref 11–51)

## 2016-03-08 LAB — POCT PREGNANCY, URINE: PREG TEST UR: NEGATIVE

## 2016-03-08 LAB — RAPID HIV SCREEN (HIV 1/2 AB+AG)
HIV 1/2 ANTIBODIES: NONREACTIVE
HIV-1 P24 Antigen - HIV24: NONREACTIVE

## 2016-03-08 LAB — CHLAMYDIA/NGC RT PCR (ARMC ONLY)
CHLAMYDIA TR: NOT DETECTED
N GONORRHOEAE: NOT DETECTED

## 2016-03-08 LAB — TSH: TSH: 0.312 u[IU]/mL — AB (ref 0.350–4.500)

## 2016-03-08 MED ORDER — METRONIDAZOLE 500 MG PO TABS: 500.0000 mg | ORAL_TABLET | Freq: Two times a day (BID) | ORAL | 0 refills | Status: AC

## 2016-03-08 NOTE — ED Notes (Signed)
MD at bedside. 

## 2016-03-08 NOTE — ED Notes (Signed)
Pelvic cart at bedside. 

## 2016-03-08 NOTE — ED Triage Notes (Addendum)
Pt presents to ED with "sob, dizziness, rapid weight loss, and abnormal vaginal discharge." pt reports lower abd cramping for over a week and has lost 5 lbs in about a week. Pt states her anxiety level has increased lately as well. Pt currently has no increased work of breathing or acute distress noted. skin is warm and dry. Pt has a list of symptoms written on piece of paper including feeling restless and dehydrated. Denies fever or vomiting.

## 2016-03-08 NOTE — ED Provider Notes (Signed)
[facility] Emergency Department Provider Note  ____________________________________________   First MD Initiated Contact with Patient 03/08/16 [phone]     (approximate)  I have reviewed the triage vital signs and the nursing notes.   HISTORY  Chief Complaint Shortness of Breath; Dizziness; Vaginal Discharge; Weight Loss; and Abdominal Pain    HPI [name] is a 25 y.o. female history of anxiety chlamydia Trichomonas p esents with multiple medical complaints patient states that she's had "rapid weight loss vaginal bleeding dyspnea anxiety feeling dehydrated and restless. Patient states vaginal discharge times approximately 2 weeks accompanied by pelvic discomfort. Patient states that the other complaints have been going on for approximately 1 year. Patient denies any suicidal or homicidal ideation   Past Medical History:  Diagnosis Date  . Anemia   . Anxiety   . Asthma   . Chlamydia   . Enlarged thyroid   . Headache   . UTI (urinary tract infection) during pregnancy     Patient Active Problem List   Diagnosis Date Noted  . Suicidal ideation 09/18/2015  . Panic disorder 09/18/2015  . Adjustment disorder with anxiety 09/18/2015  . Rectus sheath hematoma 02/28/2015  . S/P cesarean section 02/22/2015  . Supervision of normal pregnancy 02/16/2015  . DISORDER, DEPRESSIVE NEC 05/04/2007  . History of other specified conditions presenting hazards to health 04/30/2007  . TOBACCO ABUSE 12/31/2006  . MIGRAINE-COMMON 10/09/2006  . PMS 10/02/2006    Past Surgical History:  Procedure Laterality Date  . CESAREAN SECTION  2010  . CESAREAN SECTION WITH BILATERAL TUBAL LIGATION Bilateral 02/22/2015   Procedure: CESAREAN SECTION WITH BILATERAL TUBAL LIGATION;  Surgeon: Catalina Antigua, MD;  Location: [facility];  Service: Obstetrics;  Laterality: Bilateral;  . DILATATION AND CURRETAGE    . DILATION AND EVACUATION  04/01/2011   Procedure: DILATATION AND  EVACUATION (D&E);  Surgeon: Almon Hercules;  Location: [facility];  Service: Gynecology;  Laterality: N/A;  . TONSILLECTOMY  2009  . TYMPANOSTOMY TUBE PLACEMENT      Prior to Admission medications   Medication Sig Start Date End Date Taking? Authorizing Provider  acetaminophen (TYLENOL) 500 MG tablet Take 500 mg by mouth every 6 (six) hours as needed for mild pain or headache.     Historical Provider, MD  albuterol (PROVENTIL HFA;VENTOLIN HFA) 108 (90 BASE) MCG/ACT inhaler Inhale 1-2 puffs into the lungs every 6 (six) hours as needed for wheezing. 02/25/15   Montez Morita, CNM  albuterol (PROVENTIL) (2.5 MG/3ML) 0.083% nebulizer solution Inhale 3 mLs into the lungs every 6 (six) hours as needed for wheezing. 02/25/15   Montez Morita, CNM  ferrous sulfate (FERROUSUL) 325 (65 FE) MG tablet Take 1 tablet (325 mg total) by mouth 2 (two) times daily. Patient not taking: Reported on 02/28/2015 02/25/15   Dorathy Kinsman, CNM  ibuprofen (ADVIL,MOTRIN) 600 MG tablet Take 1 tablet (600 mg total) by mouth every 6 (six) hours as needed. 02/28/15   Aviva Signs, CNM  metroNIDAZOLE (FLAGYL) 500 MG tablet Take 1 tablet (500 mg total) by mouth 2 (two) times daily. 09/28/15   Emily Filbert, MD  metroNIDAZOLE (FLAGYL) 500 MG tablet Take 1 tablet (500 mg total) by mouth 2 (two) times daily. 03/08/16 03/15/16  Darci Current, MD  nitrofurantoin, macrocrystal-monohydrate, (MACROBID) 100 MG capsule Take 100 mg by mouth 2 (two) times daily. 02/26/15   Historical Provider, MD  OVER THE COUNTER MEDICATION Take 1 tablet by mouth 3 (three) times daily.  Herbal vitamin for breast feeding    Historical Provider, MD  oxyCODONE-acetaminophen (PERCOCET/ROXICET) 5-325 MG per tablet Take 1-2 tablets by mouth every 6 (six) hours as needed. 02/28/15   Aviva Signs, CNM  polyethylene glycol [facility] / GLYCOLAX) packet Take 17 g by mouth 2 (two) times daily as needed. Patient taking differently: Take 17 g by mouth 2 (two) times  daily as needed for mild constipation.  02/25/15   Dorathy Kinsman, CNM  prenatal vitamin w/FE, FA (PRENATAL 1 + 1) 27-1 MG TABS tablet Take 1 tablet by mouth daily at 12 noon.    Historical Provider, MD    Allergies No known drug allergies  Family History  Problem Relation Age of Onset  . Depression Mother   . Hyperlipidemia Mother   . Diabetes Mother   . Anesthesia problems Neg Hx     Social History Social History  Substance Use Topics  . Smoking status: Current Every Day Smoker    Packs/day: 1.00    Years: 3.00    Types: Cigarettes  . Smokeless tobacco: Never Used  . Alcohol use Yes    Review of Systems Constitutional: No fever/chills Eyes: No visual changes. ENT: No sore throat. Cardiovascular: Denies chest pain. Respiratory: Denies shortness of breath. Gastrointestinal: No abdominal pain.  No nausea, no vomiting.  No diarrhea.  No constipation. Genitourinary: Negative for dysuria. Musculoskeletal: Negative for back pain. Skin: Negative for rash. Neurological: Negative for headaches, focal weakness or numbness.  10-point ROS otherwise negative.  ____________________________________________   PHYSICAL EXAM:  VITAL SIGNS: ED Triage Vitals  Enc Vitals Group     BP 03/08/16 0112 106/61     Pulse Rate 03/08/16 0112 84     Resp 03/08/16 0530 18     Temp 03/08/16 0112 98.2 F (36.8 C)     Temp Source 03/08/16 0112 Oral     SpO2 03/08/16 0112 98 %     Weight 03/08/16 0112 110 lb (49.9 kg)     Height 03/08/16 0112 5' 3.5" (1.613 m)     Head Circumference --      Peak Flow --      Pain Score 03/08/16 0116 6     Pain Loc --      Pain Edu? --      Excl. in GC? --     Constitutional: Alert and oriented. Well appearing and in no acute distress. Eyes: Conjunctivae are normal. PERRL. EOMI. Head: Atraumatic. Ears:  Healthy appearing ear canals and TMs bilaterally Nose: No congestion/rhinnorhea. Mouth/Throat: Mucous membranes are moist.  Oropharynx  non-erythematous. Neck: No stridor.  No meningeal signs.   Cardiovascular: Normal rate, regular rhythm. Good peripheral circulation. Grossly normal heart sounds.   Respiratory: Normal respiratory effort.  No retractions. Lungs CTAB. Gastrointestinal: Soft and nontender. No distention.  Musculoskeletal: No lower extremity tenderness nor edema. No gross deformities of extremities. Neurologic:  Normal speech and language. No gross focal neurologic deficits are appreciated.  Skin:  Skin is warm, dry and intact. No rash noted. Psychiatric: Mood and affect are normal. Speech and behavior are normal.  ____________________________________________   LABS (all labs ordered are listed, but only abnormal results are displayed)  Labs Reviewed  WET PREP, GENITAL - Abnormal; Notable for the following:       Result Value   Clue Cells Wet Prep HPF POC PRESENT (*)    All other components within normal limits  COMPREHENSIVE METABOLIC PANEL - Abnormal; Notable for the following:    Glucose,  Bld 131 (*)    Calcium 8.6 (*)    AST 14 (*)    All other components within normal limits  URINALYSIS COMPLETEWITH MICROSCOPIC ([facility] ONLY) - Abnormal; Notable for the following:    Color, Urine YELLOW (*)    APPearance CLEAR (*)    Hgb urine dipstick 1+ (*)    Squamous Epithelial / LPF 0-5 (*)    All other components within normal limits  CHLAMYDIA/NGC RT PCR ([facility] ONLY)  LIPASE, BLOOD  CBC  RAPID HIV SCREEN (HIV 1/2 AB+AG)  RPR  TSH  POCT PREGNANCY, URINE   ____________________________________________  EKG  ED ECG REPORT I, Paulding N Shanine Kreiger, the attending physician, personally viewed and interpreted this ECG.   Date: 03/08/2016  EKG Time: 1:08 AM  Rate: 70  Rhythm: Normal sinus rhythm  Axis: Normal  Intervals: Normal  ST&T Change: None  ________  Procedures     INITIAL IMPRESSION / ASSESSMENT AND PLAN / ED COURSE  Pertinent labs & imaging results that were available during my  care of the patient were reviewed by me and considered in my medical decision making (see chart for details).    Clinical Course    ____________________________________________  FINAL CLINICAL IMPRESSION(S) / ED DIAGNOSES  Final diagnoses:  Anxiety  BV (bacterial vaginosis)  Hyperthyroidism     MEDICATIONS GIVEN DURING THIS VISIT:  Medications - No data to display   NEW OUTPATIENT MEDICATIONS STARTED DURING THIS VISIT:  New Prescriptions   METRONIDAZOLE (FLAGYL) 500 MG TABLET    Take 1 tablet (500 mg total) by mouth 2 (two) times daily.      Note:  This document was prepared using Dragon voice recognition software and may include unintentional dictation errors.    Darci Current, MD 03/08/16 [id number]

## 2016-03-09 LAB — RPR: RPR Ser Ql: NONREACTIVE

## 2016-04-17 ENCOUNTER — Emergency Department
Admission: EM | Admit: 2016-04-17 | Discharge: 2016-04-17 | Disposition: A | Discharge status: Home / Self Care | Payer: BLUE CROSS/BLUE SHIELD | Attending: Emergency Medicine | Admitting: Emergency Medicine

## 2016-04-17 DIAGNOSIS — J45909 Unspecified asthma, uncomplicated: Secondary | ICD-10-CM | POA: Diagnosis not present

## 2016-04-17 DIAGNOSIS — R300 Dysuria: Secondary | ICD-10-CM | POA: Diagnosis not present

## 2016-04-17 DIAGNOSIS — N898 Other specified noninflammatory disorders of vagina: Secondary | ICD-10-CM | POA: Diagnosis present

## 2016-04-17 DIAGNOSIS — F17210 Nicotine dependence, cigarettes, uncomplicated: Secondary | ICD-10-CM | POA: Insufficient documentation

## 2016-04-17 DIAGNOSIS — R102 Pelvic and perineal pain: Secondary | ICD-10-CM | POA: Diagnosis not present

## 2016-04-17 DIAGNOSIS — Z791 Long term (current) use of non-steroidal anti-inflammatories (NSAID): Secondary | ICD-10-CM | POA: Diagnosis not present

## 2016-04-17 DIAGNOSIS — R197 Diarrhea, unspecified: Secondary | ICD-10-CM | POA: Insufficient documentation

## 2016-04-17 LAB — URINALYSIS COMPLETE WITH MICROSCOPIC (ARMC ONLY)
BILIRUBIN URINE: NEGATIVE
Glucose, UA: NEGATIVE mg/dL
Hgb urine dipstick: NEGATIVE
KETONES UR: NEGATIVE mg/dL
NITRITE: NEGATIVE
Protein, ur: NEGATIVE mg/dL
Specific Gravity, Urine: 1.023 (ref 1.005–1.030)
pH: 6 (ref 5.0–8.0)

## 2016-04-17 LAB — WET PREP, GENITAL
Clue Cells Wet Prep HPF POC: NONE SEEN
Sperm: NONE SEEN
Trich, Wet Prep: NONE SEEN

## 2016-04-17 LAB — PREGNANCY, URINE: Preg Test, Ur: NEGATIVE

## 2016-04-17 LAB — CHLAMYDIA/NGC RT PCR (ARMC ONLY)
CHLAMYDIA TR: NOT DETECTED
N GONORRHOEAE: NOT DETECTED

## 2016-04-17 MED ORDER — FLUCONAZOLE 150 MG PO TABS: 150.0000 mg | ORAL_TABLET | Freq: Every day | ORAL | 0 refills | Status: AC

## 2016-04-17 MED ORDER — METRONIDAZOLE 500 MG PO TABS: 500.0000 mg | ORAL_TABLET | Freq: Two times a day (BID) | ORAL | 0 refills | Status: DC

## 2016-04-17 MED ORDER — FLUCONAZOLE 50 MG PO TABS
150.0000 mg | ORAL_TABLET | Freq: Once | ORAL | Status: AC
  Administered 2016-04-17: 150 mg via ORAL
  Filled 2016-04-17: qty 1

## 2016-04-17 MED ORDER — LIDOCAINE HCL (PF) 1 % IJ SOLN
INTRAMUSCULAR | Status: AC
  Administered 2016-04-17: 0.9 mL
  Filled 2016-04-17: qty 5

## 2016-04-17 MED ORDER — METRONIDAZOLE 500 MG PO TABS
500.0000 mg | ORAL_TABLET | Freq: Once | ORAL | Status: AC
  Administered 2016-04-17: 500 mg via ORAL
  Filled 2016-04-17: qty 1

## 2016-04-17 MED ORDER — OXYCODONE-ACETAMINOPHEN 5-325 MG PO TABS
2.0000 | ORAL_TABLET | Freq: Once | ORAL | Status: AC
  Administered 2016-04-17: 2 via ORAL
  Filled 2016-04-17 (×2): qty 2

## 2016-04-17 MED ORDER — CEFTRIAXONE SODIUM 250 MG IJ SOLR
250.0000 mg | INTRAMUSCULAR | Status: DC
  Administered 2016-04-17: 250 mg via INTRAMUSCULAR
  Filled 2016-04-17: qty 250

## 2016-04-17 MED ORDER — LIDOCAINE HCL (PF) 1 % IJ SOLN
0.9000 mL | Freq: Once | INTRAMUSCULAR | Status: AC
  Administered 2016-04-17: 0.9 mL

## 2016-04-17 MED ORDER — AZITHROMYCIN 500 MG PO TABS
1000.0000 mg | ORAL_TABLET | Freq: Once | ORAL | Status: AC
  Administered 2016-04-17: 1000 mg via ORAL
  Filled 2016-04-17: qty 2

## 2016-04-17 NOTE — ED Notes (Signed)
See paper chart for triage note.

## 2016-04-18 LAB — URINE CULTURE

## 2016-05-02 NOTE — ED Provider Notes (Signed)
Previous downtime form and paper electronic medical record was signed by me   Emily FilbertJonathan E Anvita Hirata, MD 05/02/16 2352

## 2016-05-07 ENCOUNTER — Emergency Department
Admission: EM | Admit: 2016-05-07 | Discharge: 2016-05-07 | Disposition: A | Discharge status: Home / Self Care | Payer: BLUE CROSS/BLUE SHIELD | Attending: Emergency Medicine | Admitting: Emergency Medicine

## 2016-05-07 ENCOUNTER — Emergency Department: Payer: BLUE CROSS/BLUE SHIELD

## 2016-05-07 DIAGNOSIS — S80212A Abrasion, left knee, initial encounter: Secondary | ICD-10-CM | POA: Insufficient documentation

## 2016-05-07 DIAGNOSIS — S70212A Abrasion, left hip, initial encounter: Secondary | ICD-10-CM | POA: Diagnosis not present

## 2016-05-07 DIAGNOSIS — Z791 Long term (current) use of non-steroidal anti-inflammatories (NSAID): Secondary | ICD-10-CM | POA: Insufficient documentation

## 2016-05-07 DIAGNOSIS — Z23 Encounter for immunization: Secondary | ICD-10-CM | POA: Insufficient documentation

## 2016-05-07 DIAGNOSIS — S80211A Abrasion, right knee, initial encounter: Secondary | ICD-10-CM | POA: Insufficient documentation

## 2016-05-07 DIAGNOSIS — T148XXA Other injury of unspecified body region, initial encounter: Secondary | ICD-10-CM | POA: Diagnosis not present

## 2016-05-07 DIAGNOSIS — Y92410 Unspecified street and highway as the place of occurrence of the external cause: Secondary | ICD-10-CM | POA: Insufficient documentation

## 2016-05-07 DIAGNOSIS — Z79899 Other long term (current) drug therapy: Secondary | ICD-10-CM | POA: Diagnosis not present

## 2016-05-07 DIAGNOSIS — Y999 Unspecified external cause status: Secondary | ICD-10-CM | POA: Insufficient documentation

## 2016-05-07 DIAGNOSIS — S8012XA Contusion of left lower leg, initial encounter: Secondary | ICD-10-CM

## 2016-05-07 DIAGNOSIS — Y9389 Activity, other specified: Secondary | ICD-10-CM | POA: Insufficient documentation

## 2016-05-07 DIAGNOSIS — J45909 Unspecified asthma, uncomplicated: Secondary | ICD-10-CM | POA: Insufficient documentation

## 2016-05-07 DIAGNOSIS — F17210 Nicotine dependence, cigarettes, uncomplicated: Secondary | ICD-10-CM | POA: Insufficient documentation

## 2016-05-07 DIAGNOSIS — S79912A Unspecified injury of left hip, initial encounter: Secondary | ICD-10-CM | POA: Diagnosis present

## 2016-05-07 DIAGNOSIS — S60221A Contusion of right hand, initial encounter: Secondary | ICD-10-CM

## 2016-05-07 DIAGNOSIS — S8011XA Contusion of right lower leg, initial encounter: Secondary | ICD-10-CM

## 2016-05-07 DIAGNOSIS — S60511A Abrasion of right hand, initial encounter: Secondary | ICD-10-CM | POA: Insufficient documentation

## 2016-05-07 DIAGNOSIS — Z792 Long term (current) use of antibiotics: Secondary | ICD-10-CM | POA: Diagnosis not present

## 2016-05-07 DIAGNOSIS — T07XXXA Unspecified multiple injuries, initial encounter: Secondary | ICD-10-CM

## 2016-05-07 LAB — URINALYSIS COMPLETE WITH MICROSCOPIC (ARMC ONLY)
BILIRUBIN URINE: NEGATIVE
Glucose, UA: NEGATIVE mg/dL
Hgb urine dipstick: NEGATIVE
KETONES UR: NEGATIVE mg/dL
Leukocytes, UA: NEGATIVE
Nitrite: POSITIVE — AB
PH: 5 (ref 5.0–8.0)
PROTEIN: 30 mg/dL — AB
Specific Gravity, Urine: 1.02 (ref 1.005–1.030)

## 2016-05-07 LAB — POCT PREGNANCY, URINE: Preg Test, Ur: NEGATIVE

## 2016-05-07 MED ORDER — TETANUS-DIPHTH-ACELL PERTUSSIS 5-2.5-18.5 LF-MCG/0.5 IM SUSP
0.5000 mL | Freq: Once | INTRAMUSCULAR | Status: AC
  Administered 2016-05-07: 0.5 mL via INTRAMUSCULAR

## 2016-05-07 MED ORDER — HYDROCODONE-ACETAMINOPHEN 5-325 MG PO TABS: 1.0000 | ORAL_TABLET | ORAL | 0 refills | Status: DC | PRN

## 2016-05-07 MED ORDER — TETANUS-DIPHTH-ACELL PERTUSSIS 5-2.5-18.5 LF-MCG/0.5 IM SUSP
INTRAMUSCULAR | Status: AC
  Administered 2016-05-07: 0.5 mL via INTRAMUSCULAR
  Filled 2016-05-07: qty 0.5
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
## 2016-12-05 ENCOUNTER — Encounter: Payer: Self-pay | Admitting: Physician Assistant

## 2016-12-05 ENCOUNTER — Emergency Department
Admission: EM | Admit: 2016-12-05 | Discharge: 2016-12-05 | Disposition: A | Discharge status: Home / Self Care | Payer: BLUE CROSS/BLUE SHIELD | Attending: Emergency Medicine | Admitting: Emergency Medicine

## 2016-12-05 DIAGNOSIS — N898 Other specified noninflammatory disorders of vagina: Secondary | ICD-10-CM

## 2016-12-05 DIAGNOSIS — Z791 Long term (current) use of non-steroidal anti-inflammatories (NSAID): Secondary | ICD-10-CM | POA: Insufficient documentation

## 2016-12-05 DIAGNOSIS — J45909 Unspecified asthma, uncomplicated: Secondary | ICD-10-CM | POA: Diagnosis not present

## 2016-12-05 DIAGNOSIS — Z79899 Other long term (current) drug therapy: Secondary | ICD-10-CM | POA: Diagnosis not present

## 2016-12-05 DIAGNOSIS — F17210 Nicotine dependence, cigarettes, uncomplicated: Secondary | ICD-10-CM | POA: Insufficient documentation

## 2016-12-05 LAB — URINALYSIS, COMPLETE (UACMP) WITH MICROSCOPIC
BILIRUBIN URINE: NEGATIVE
GLUCOSE, UA: NEGATIVE mg/dL
HGB URINE DIPSTICK: NEGATIVE
Ketones, ur: NEGATIVE mg/dL
NITRITE: NEGATIVE
PH: 5 (ref 5.0–8.0)
Protein, ur: NEGATIVE mg/dL
SPECIFIC GRAVITY, URINE: 1.026 (ref 1.005–1.030)

## 2016-12-05 LAB — COMPREHENSIVE METABOLIC PANEL
ALT: 16 U/L (ref 14–54)
AST: 25 U/L (ref 15–41)
Albumin: 4.2 g/dL (ref 3.5–5.0)
Alkaline Phosphatase: 47 U/L (ref 38–126)
Anion gap: 9 (ref 5–15)
BILIRUBIN TOTAL: 0.6 mg/dL (ref 0.3–1.2)
BUN: 12 mg/dL (ref 6–20)
CHLORIDE: 104 mmol/L (ref 101–111)
CO2: 25 mmol/L (ref 22–32)
CREATININE: 0.75 mg/dL (ref 0.44–1.00)
Calcium: 8.9 mg/dL (ref 8.9–10.3)
Glucose, Bld: 111 mg/dL — ABNORMAL HIGH (ref 65–99)
Potassium: 3.7 mmol/L (ref 3.5–5.1)
Sodium: 138 mmol/L (ref 135–145)
TOTAL PROTEIN: 6.8 g/dL (ref 6.5–8.1)

## 2016-12-05 LAB — LIPASE, BLOOD: LIPASE: 25 U/L (ref 11–51)

## 2016-12-05 LAB — CBC
HCT: 40.5 % (ref 35.0–47.0)
Hemoglobin: 13.8 g/dL (ref 12.0–16.0)
MCH: 32.9 pg (ref 26.0–34.0)
MCHC: 34 g/dL (ref 32.0–36.0)
MCV: 96.9 fL (ref 80.0–100.0)
PLATELETS: 195 10*3/uL (ref 150–440)
RBC: 4.18 MIL/uL (ref 3.80–5.20)
RDW: 12.5 % (ref 11.5–14.5)
WBC: 7 10*3/uL (ref 3.6–11.0)

## 2016-12-05 LAB — WET PREP, GENITAL
CLUE CELLS WET PREP: NONE SEEN
SPERM: NONE SEEN
TRICH WET PREP: NONE SEEN
YEAST WET PREP: NONE SEEN

## 2016-12-05 LAB — CHLAMYDIA/NGC RT PCR (ARMC ONLY)
Chlamydia Tr: NOT DETECTED
N GONORRHOEAE: NOT DETECTED

## 2016-12-05 LAB — POCT PREGNANCY, URINE: PREG TEST UR: NEGATIVE

## 2016-12-05 MED ORDER — AZITHROMYCIN 500 MG PO TABS: 1000.0000 mg | ORAL_TABLET | Freq: Once | ORAL | Status: DC

## 2016-12-05 MED ORDER — CEFTRIAXONE SODIUM 250 MG IJ SOLR: 250.0000 mg | Freq: Once | INTRAMUSCULAR | Status: DC

## 2016-12-05 NOTE — ED Triage Notes (Signed)
Abdominal cramping and vaginal discharge X 2 weeks. Foul odor with discharge over last few days.

## 2016-12-05 NOTE — ED Notes (Signed)
See triage note  States she noticed some lower abd pain with some vaginal discharge   States the discharge has a foul smell.. Also having urinary sx's

## 2016-12-05 NOTE — ED Provider Notes (Signed)
[facility] Emergency Department Provider Note ____________________________________________  Time seen: 1150  I have reviewed the triage vital signs and the nursing notes.  HISTORY  Chief Complaint  Abdominal Pain and Vaginal Discharge  HPI [name] is a 26 y.o. female presents to the ED for evaluation of abdominal cramping and vaginal discharge the last 2 weeks. She describes over the last few days however the discharge is become malodorous. She describes some mild irritation but denies any abnormal vaginal bleeding, dysuria, hematuria, or vaginal lesions. She has had bacterial vaginosis multiple times in the past. Denies any new sexual partners or exposures. She is in a monogamous relationship with her boyfriend.  Past Medical History:  Diagnosis Date  . Anemia   . Anxiety   . Asthma   . Chlamydia   . Enlarged thyroid   . Headache   . UTI (urinary tract infection) during pregnancy     Patient Active Problem List   Diagnosis Date Noted  . Suicidal ideation 09/18/2015  . Panic disorder 09/18/2015  . Adjustment disorder with anxiety 09/18/2015  . Rectus sheath hematoma 02/28/2015  . S/P cesarean section 02/22/2015  . Supervision of normal pregnancy 02/16/2015  . DISORDER, DEPRESSIVE NEC 05/04/2007  . History of other specified conditions presenting hazards to health 04/30/2007  . TOBACCO ABUSE 12/31/2006  . MIGRAINE-COMMON 10/09/2006  . PMS 10/02/2006    Past Surgical History:  Procedure Laterality Date  . CESAREAN SECTION  2010  . CESAREAN SECTION WITH BILATERAL TUBAL LIGATION Bilateral 02/22/2015   Procedure: CESAREAN SECTION WITH BILATERAL TUBAL LIGATION;  Surgeon: Catalina Antigua, MD;  Location: [facility];  Service: Obstetrics;  Laterality: Bilateral;  . DILATATION AND CURRETAGE    . DILATION AND EVACUATION  04/01/2011   Procedure: DILATATION AND EVACUATION (D&E);  Surgeon: Almon Hercules;  Location: [facility];  Service: Gynecology;   Laterality: N/A;  . TONSILLECTOMY  2009  . TYMPANOSTOMY TUBE PLACEMENT      Prior to Admission medications   Medication Sig Start Date End Date Taking? Authorizing Provider  acetaminophen (TYLENOL) 500 MG tablet Take 500 mg by mouth every 6 (six) hours as needed for mild pain or headache.     Historical Provider, MD  albuterol (PROVENTIL HFA;VENTOLIN HFA) 108 (90 BASE) MCG/ACT inhaler Inhale 1-2 puffs into the lungs every 6 (six) hours as needed for wheezing. 02/25/15   Montez Morita, CNM  albuterol (PROVENTIL) (2.5 MG/3ML) 0.083% nebulizer solution Inhale 3 mLs into the lungs every 6 (six) hours as needed for wheezing. 02/25/15   Montez Morita, CNM  ferrous sulfate (FERROUSUL) 325 (65 FE) MG tablet Take 1 tablet (325 mg total) by mouth 2 (two) times daily. Patient not taking: Reported on 02/28/2015 02/25/15   Dorathy Kinsman, CNM  HYDROcodone-acetaminophen (NORCO/VICODIN) 5-325 MG tablet Take 1-2 tablets by mouth every 4 (four) hours as needed for moderate pain. 05/07/16   Loleta Rose, MD  ibuprofen (ADVIL,MOTRIN) 600 MG tablet Take 1 tablet (600 mg total) by mouth every 6 (six) hours as needed. 02/28/15   Aviva Signs, CNM  metroNIDAZOLE (FLAGYL) 500 MG tablet Take 1 tablet (500 mg total) by mouth 2 (two) times daily. 09/28/15   Emily Filbert, MD  metroNIDAZOLE (FLAGYL) 500 MG tablet Take 1 tablet (500 mg total) by mouth 2 (two) times daily. 04/17/16   Emily Filbert, MD  nitrofurantoin, macrocrystal-monohydrate, (MACROBID) 100 MG capsule Take 100 mg by mouth 2 (two) times daily. 02/26/15   Historical Provider,  MD  OVER THE COUNTER MEDICATION Take 1 tablet by mouth 3 (three) times daily. Herbal vitamin for breast feeding    Historical Provider, MD  oxyCODONE-acetaminophen (PERCOCET/ROXICET) 5-325 MG per tablet Take 1-2 tablets by mouth every 6 (six) hours as needed. 02/28/15   Aviva SignsMarie L Williams, CNM  polyethylene glycol [facility](MIRALAX / GLYCOLAX) packet Take 17 g by mouth 2 (two) times daily as  needed. Patient taking differently: Take 17 g by mouth 2 (two) times daily as needed for mild constipation.  02/25/15   Dorathy KinsmanVirginia Smith, CNM  prenatal vitamin w/FE, FA (PRENATAL 1 + 1) 27-1 MG TABS tablet Take 1 tablet by mouth daily at 12 noon.    Historical Provider, MD    Allergies Patient has no known allergies.  Family History  Problem Relation Age of Onset  . Depression Mother   . Hyperlipidemia Mother   . Diabetes Mother   . Anesthesia problems Neg Hx     Social History Social History  Substance Use Topics  . Smoking status: Current Every Day Smoker    Packs/day: 1.00    Years: 3.00    Types: Cigarettes  . Smokeless tobacco: Never Used  . Alcohol use Yes    Review of Systems  Constitutional: Negative for fever. Cardiovascular: Negative for chest pain. Respiratory: Negative for shortness of breath. Gastrointestinal: Negative for abdominal pain, vomiting and diarrhea. Genitourinary: Negative for dysuria. Reports vaginal discharge as above.  Musculoskeletal: Negative for back pain. Skin: Negative for rash. Neurological: Negative for headaches, focal weakness or numbness. ____________________________________________  PHYSICAL EXAM:  VITAL SIGNS: ED Triage Vitals  Enc Vitals Group     BP 12/05/16 1007 (!) 101/57     Pulse Rate 12/05/16 1007 67     Resp 12/05/16 1007 16     Temp 12/05/16 1007 98.2 F (36.8 C)     Temp Source 12/05/16 1007 Oral     SpO2 12/05/16 1007 100 %     Weight 12/05/16 1007 123 lb (55.8 kg)     Height 12/05/16 1007 5\' 3"  (1.6 m)     Head Circumference --      Peak Flow --      Pain Score 12/05/16 1006 7     Pain Loc --      Pain Edu? --      Excl. in GC? --     Constitutional: Alert and oriented. Well appearing and in no distress. Head: Normocephalic and atraumatic. Cardiovascular: Normal rate, regular rhythm. Normal distal pulses. Respiratory: Normal respiratory effort.  GU: Normal external genitalia. Milky, thin, white vaginal  discharge noted in the canal. No CMT or adnexal masses noted.  Musculoskeletal: Nontender with normal range of motion in all extremities.  Neurologic:  Normal gait without ataxia. Normal speech and language. No gross focal neurologic deficits are appreciated. Skin:  Skin is warm, dry and intact. No rash noted. ____________________________________________   LABS (pertinent positives/negatives)  Labs Reviewed  WET PREP, GENITAL - Abnormal; Notable for the following:       Result Value   WBC, Wet Prep HPF POC FEW (*)    All other components within normal limits  COMPREHENSIVE METABOLIC PANEL - Abnormal; Notable for the following:    Glucose, Bld 111 (*)    All other components within normal limits  URINALYSIS, COMPLETE (UACMP) WITH MICROSCOPIC - Abnormal; Notable for the following:    Color, Urine YELLOW (*)    APPearance HAZY (*)    Leukocytes, UA TRACE (*)  Bacteria, UA RARE (*)    Squamous Epithelial / LPF 6-30 (*)    All other components within normal limits  CHLAMYDIA/NGC RT PCR ([facility] ONLY)  LIPASE, BLOOD  CBC  POC URINE PREG, ED  POCT PREGNANCY, URINE  ____________________________________________  INITIAL IMPRESSION / ASSESSMENT AND PLAN / ED COURSE  Patient with a diagnosis of vaginal discharge, likely physiological on presentation. No concern for other infection cause, given her normal wet prep at the time of disposition. She will call back for [facility] culture results. She will otherwise follow-up with [facility] or her provider as needed.  ____________________________________________  FINAL CLINICAL IMPRESSION(S) / ED DIAGNOSES  Final diagnoses:  Vaginal discharge      Lissa Hoard, PA-C 12/05/16 1803    Rockne Menghini, MD 12/09/16 [phone]

## 2016-12-05 NOTE — Discharge Instructions (Signed)
Your exam and initial labs are normal. You have one additional test pending. Feel free to call back in one hour for results. [facility] provider or [facility] needed.

## 2017-02-19 ENCOUNTER — Encounter: Payer: Self-pay | Admitting: Emergency Medicine

## 2017-02-19 ENCOUNTER — Emergency Department
Admission: EM | Admit: 2017-02-19 | Discharge: 2017-02-19 | Disposition: A | Discharge status: Home / Self Care | Payer: BLUE CROSS/BLUE SHIELD | Attending: Emergency Medicine | Admitting: Emergency Medicine

## 2017-02-19 DIAGNOSIS — F17210 Nicotine dependence, cigarettes, uncomplicated: Secondary | ICD-10-CM | POA: Diagnosis not present

## 2017-02-19 DIAGNOSIS — N760 Acute vaginitis: Secondary | ICD-10-CM | POA: Insufficient documentation

## 2017-02-19 DIAGNOSIS — N898 Other specified noninflammatory disorders of vagina: Secondary | ICD-10-CM | POA: Diagnosis present

## 2017-02-19 DIAGNOSIS — Z79899 Other long term (current) drug therapy: Secondary | ICD-10-CM | POA: Insufficient documentation

## 2017-02-19 DIAGNOSIS — J45909 Unspecified asthma, uncomplicated: Secondary | ICD-10-CM | POA: Insufficient documentation

## 2017-02-19 DIAGNOSIS — B9689 Other specified bacterial agents as the cause of diseases classified elsewhere: Secondary | ICD-10-CM

## 2017-02-19 LAB — URINALYSIS, COMPLETE (UACMP) WITH MICROSCOPIC
BACTERIA UA: NONE SEEN
Bilirubin Urine: NEGATIVE
Glucose, UA: NEGATIVE mg/dL
Hgb urine dipstick: NEGATIVE
KETONES UR: NEGATIVE mg/dL
Leukocytes, UA: NEGATIVE
Nitrite: NEGATIVE
PROTEIN: NEGATIVE mg/dL
RBC / HPF: NONE SEEN RBC/hpf (ref 0–5)
Specific Gravity, Urine: 1.021 (ref 1.005–1.030)
WBC UA: NONE SEEN WBC/hpf (ref 0–5)
pH: 7 (ref 5.0–8.0)

## 2017-02-19 LAB — COMPREHENSIVE METABOLIC PANEL
ALBUMIN: 4.4 g/dL (ref 3.5–5.0)
ALT: 16 U/L (ref 14–54)
AST: 19 U/L (ref 15–41)
Alkaline Phosphatase: 57 U/L (ref 38–126)
Anion gap: 6 (ref 5–15)
BILIRUBIN TOTAL: 0.5 mg/dL (ref 0.3–1.2)
BUN: 8 mg/dL (ref 6–20)
CHLORIDE: 109 mmol/L (ref 101–111)
CO2: 27 mmol/L (ref 22–32)
Calcium: 8.8 mg/dL — ABNORMAL LOW (ref 8.9–10.3)
Creatinine, Ser: 0.89 mg/dL (ref 0.44–1.00)
GFR calc Af Amer: >60 mL/min (ref >60)
GFR calc non Af Amer: >60 mL/min (ref >60)
GLUCOSE: 87 mg/dL (ref 65–99)
POTASSIUM: 3.8 mmol/L (ref 3.5–5.1)
SODIUM: 142 mmol/L (ref 135–145)
Total Protein: 6.8 g/dL (ref 6.5–8.1)

## 2017-02-19 LAB — CHLAMYDIA/NGC RT PCR (ARMC ONLY)
CHLAMYDIA TR: NOT DETECTED
N GONORRHOEAE: NOT DETECTED

## 2017-02-19 LAB — CBC
HEMATOCRIT: 39.7 % (ref 35.0–47.0)
Hemoglobin: 13.7 g/dL (ref 12.0–16.0)
MCH: 33 pg (ref 26.0–34.0)
MCHC: 34.5 g/dL (ref 32.0–36.0)
MCV: 95.7 fL (ref 80.0–100.0)
Platelets: 224 10*3/uL (ref 150–440)
RBC: 4.15 MIL/uL (ref 3.80–5.20)
RDW: 12.6 % (ref 11.5–14.5)
WBC: 11.9 10*3/uL — ABNORMAL HIGH (ref 3.6–11.0)

## 2017-02-19 LAB — WET PREP, GENITAL
Sperm: NONE SEEN
Trich, Wet Prep: NONE SEEN
Yeast Wet Prep HPF POC: NONE SEEN

## 2017-02-19 LAB — LIPASE, BLOOD: Lipase: 32 U/L (ref 11–51)

## 2017-02-19 LAB — POCT PREGNANCY, URINE: PREG TEST UR: NEGATIVE

## 2017-02-19 MED ORDER — METRONIDAZOLE 500 MG PO TABS: 500.0000 mg | ORAL_TABLET | Freq: Two times a day (BID) | ORAL | 0 refills | Status: AC

## 2017-02-19 NOTE — ED Provider Notes (Signed)
[facility] Emergency Department Provider Note    First MD Initiated Contact with Patient 02/19/17 0159     (approximate)  I have reviewed the triage vital signs and the nursing notes.   HISTORY  Chief Complaint Vaginal Discharge and Abdominal Pain    HPI [name] is a 26 y.o. female presents with 2 day history of white vaginal discharge and pelvic discomfort. Patient denies any dysuria no fever no nausea or vomiting. Patient does admit to unprotected sex with one sexual partner. Patient states that that sexual partner was unfaithful raising concern for possible STD.   Past Medical History:  Diagnosis Date  . Anemia   . Anxiety   . Asthma   . Chlamydia   . Enlarged thyroid   . Headache   . UTI (urinary tract infection) during pregnancy     Patient Active Problem List   Diagnosis Date Noted  . Suicidal ideation 09/18/2015  . Panic disorder 09/18/2015  . Adjustment disorder with anxiety 09/18/2015  . Rectus sheath hematoma 02/28/2015  . S/P cesarean section 02/22/2015  . Supervision of normal pregnancy 02/16/2015  . DISORDER, DEPRESSIVE NEC 05/04/2007  . History of other specified conditions presenting hazards to health 04/30/2007  . TOBACCO ABUSE 12/31/2006  . MIGRAINE-COMMON 10/09/2006  . PMS 10/02/2006    Past Surgical History:  Procedure Laterality Date  . CESAREAN SECTION  2010  . CESAREAN SECTION WITH BILATERAL TUBAL LIGATION Bilateral 02/22/2015   Procedure: CESAREAN SECTION WITH BILATERAL TUBAL LIGATION;  Surgeon: Catalina AntiguaPeggy Constant, MD;  Location: [facility];  Service: Obstetrics;  Laterality: Bilateral;  . DILATATION AND CURRETAGE    . DILATION AND EVACUATION  04/01/2011   Procedure: DILATATION AND EVACUATION (D&E);  Surgeon: Almon HerculesKendra H. Ross;  Location: [facility];  Service: Gynecology;  Laterality: N/A;  . TONSILLECTOMY  2009  . TYMPANOSTOMY TUBE PLACEMENT      Prior to Admission medications   Medication Sig Start Date End Date  Taking? Authorizing Provider  acetaminophen (TYLENOL) 500 MG tablet Take 500 mg by mouth every 6 (six) hours as needed for mild pain or headache.     [provider]  albuterol (PROVENTIL HFA;VENTOLIN HFA) 108 (90 BASE) MCG/ACT inhaler Inhale 1-2 puffs into the lungs every 6 (six) hours as needed for wheezing. 02/25/15   Montez MoritaLawson, Marie D, CNM  albuterol (PROVENTIL) (2.5 MG/3ML) 0.083% nebulizer solution Inhale 3 mLs into the lungs every 6 (six) hours as needed for wheezing. 02/25/15   Montez MoritaLawson, Marie D, CNM  ferrous sulfate (FERROUSUL) 325 (65 FE) MG tablet Take 1 tablet (325 mg total) by mouth 2 (two) times daily. Patient not taking: Reported on 02/28/2015 02/25/15   Katrinka BlazingSmith, [place], CNM  HYDROcodone-acetaminophen (NORCO/VICODIN) 5-325 MG tablet Take 1-2 tablets by mouth every 4 (four) hours as needed for moderate pain. 05/07/16   Loleta RoseForbach, Cory, MD  ibuprofen (ADVIL,MOTRIN) 600 MG tablet Take 1 tablet (600 mg total) by mouth every 6 (six) hours as needed. 02/28/15   Aviva SignsWilliams, Marie L, CNM  metroNIDAZOLE (FLAGYL) 500 MG tablet Take 1 tablet (500 mg total) by mouth 2 (two) times daily. 09/28/15   Emily FilbertWilliams, Jonathan E, MD  metroNIDAZOLE (FLAGYL) 500 MG tablet Take 1 tablet (500 mg total) by mouth 2 (two) times daily. 04/17/16   Emily FilbertWilliams, Jonathan E, MD  nitrofurantoin, macrocrystal-monohydrate, (MACROBID) 100 MG capsule Take 100 mg by mouth 2 (two) times daily. 02/26/15   [provider]  OVER THE COUNTER MEDICATION Take 1 tablet by mouth  3 (three) times daily. Herbal vitamin for breast feeding    [provider]  oxyCODONE-acetaminophen (PERCOCET/ROXICET) 5-325 MG per tablet Take 1-2 tablets by mouth every 6 (six) hours as needed. 02/28/15   Aviva Signs, CNM  polyethylene glycol (MIRALAX / GLYCOLAX) packet Take 17 g by mouth 2 (two) times daily as needed. Patient taking differently: Take 17 g by mouth 2 (two) times daily as needed for mild constipation.  02/25/15   Katrinka Blazing, [place],  CNM  prenatal vitamin w/FE, FA (PRENATAL 1 + 1) 27-1 MG TABS tablet Take 1 tablet by mouth daily at 12 noon.    [provider]    Allergies Patient has no known allergies.  Family History  Problem Relation Age of Onset  . Depression Mother   . Hyperlipidemia Mother   . Diabetes Mother   . Anesthesia problems Neg Hx     Social History Social History  Substance Use Topics  . Smoking status: Current Every Day Smoker    Packs/day: 1.00    Years: 3.00    Types: Cigarettes  . Smokeless tobacco: Never Used  . Alcohol use Yes    Review of Systems Constitutional: No fever/chills Eyes: No visual changes. ENT: No sore throat. Cardiovascular: Denies chest pain. Respiratory: Denies shortness of breath. Gastrointestinal: No abdominal pain.  No nausea, no vomiting.  No diarrhea.  No constipation. Genitourinary: Negative for dysuria.Positive vaginal discharge Musculoskeletal: Negative for neck pain.  Negative for back pain. Integumentary: Negative for rash. Neurological: Negative for headaches, focal weakness or numbness.   ____________________________________________   PHYSICAL EXAM:  VITAL SIGNS: ED Triage Vitals [02/19/17 0127]  Enc Vitals Group     BP 114/68     Pulse Rate 68     Resp 18     Temp 98 F (36.7 C)     Temp Source Oral     SpO2 99 %     Weight 59 kg (130 lb)     Height 1.6 m (5\' 3" )     Head Circumference      Peak Flow      Pain Score 7     Pain Loc      Pain Edu?      Excl. in GC?     Constitutional: Alert and oriented. Well appearing and in no acute distress. Eyes: Conjunctivae are normal.  Head: Atraumatic. Mouth/Throat: Mucous membranes are moist.  Oropharynx non-erythematous. Neck: No stridor.   Cardiovascular: Normal rate, regular rhythm. Good peripheral circulation. Grossly normal heart sounds. Respiratory: Normal respiratory effort.  No retractions. Lungs CTAB. Gastrointestinal: Soft and nontender. No distention.    Genitourinary: White vaginal discharge Musculoskeletal: No lower extremity tenderness nor edema. No gross deformities of extremities. Neurologic:  Normal speech and language. No gross focal neurologic deficits are appreciated.  Skin:  Skin is warm, dry and intact. No rash noted. Psychiatric: Mood and affect are normal. Speech and behavior are normal.  ____________________________________________   LABS (all labs ordered are listed, but only abnormal results are displayed)  Labs Reviewed  WET PREP, GENITAL - Abnormal; Notable for the following:       Result Value   Clue Cells Wet Prep HPF POC PRESENT (*)    WBC, Wet Prep HPF POC TOO NUMEROUS TO COUNT (*)    All other components within normal limits  COMPREHENSIVE METABOLIC PANEL - Abnormal; Notable for the following:    Calcium 8.8 (*)    All other components within normal limits  CBC -  Abnormal; Notable for the following:    WBC 11.9 (*)    All other components within normal limits  URINALYSIS, COMPLETE (UACMP) WITH MICROSCOPIC - Abnormal; Notable for the following:    Color, Urine YELLOW (*)    APPearance CLOUDY (*)    Squamous Epithelial / LPF 0-5 (*)    All other components within normal limits  CHLAMYDIA/NGC RT PCR ([facility] ONLY)  LIPASE, BLOOD  POC URINE PREG, ED  POCT PREGNANCY, URINE     Procedures   ____________________________________________   INITIAL IMPRESSION / ASSESSMENT AND PLAN / ED COURSE  Pertinent labs & imaging results that were available during my care of the patient were reviewed by me and considered in my medical decision making (see chart for details).  26 year old female presenting with concerns for possible STD. Vaginal swabs reveal no evidence of Chlamydia or gonorrhea. Patient however is positive for bacterial vaginosis. Patient will be prescribed Flagyl for home      ____________________________________________  FINAL CLINICAL IMPRESSION(S) / ED DIAGNOSES  Final diagnoses:   Bacterial vaginosis     MEDICATIONS GIVEN DURING THIS VISIT:  Medications - No data to display   NEW OUTPATIENT MEDICATIONS STARTED DURING THIS VISIT:  New Prescriptions   No medications on file    Modified Medications   No medications on file    Discontinued Medications   No medications on file     Note:  This document was prepared using Dragon voice recognition software and may include unintentional dictation errors.    Darci Current, MD 02/19/17 [phone]

## 2017-02-19 NOTE — ED Notes (Addendum)
Pt states that for past two days she has stomach pain, very bad headache, vaginal discharge with foul smell. She found out that sexual partner has been cheating. Feels drained and has small bumps around lips that she popped but says it could just be a pimple. Also states urinating more frequently.

## 2017-02-19 NOTE — ED Triage Notes (Addendum)
Patient ambulatory to triage with steady gait, without difficulty or distress noted; pt reports white vag discharge x 2 days and "sweat bumps" around mouth; denies any urinary c/o but reports lower abd pain as well

## 2017-07-06 ENCOUNTER — Encounter: Payer: Self-pay | Admitting: Emergency Medicine

## 2017-07-06 ENCOUNTER — Emergency Department
Admission: EM | Admit: 2017-07-06 | Discharge: 2017-07-06 | Disposition: A | Discharge status: Home / Self Care | Payer: BLUE CROSS/BLUE SHIELD | Attending: Emergency Medicine | Admitting: Emergency Medicine

## 2017-07-06 ENCOUNTER — Other Ambulatory Visit: Payer: Self-pay

## 2017-07-06 DIAGNOSIS — F17210 Nicotine dependence, cigarettes, uncomplicated: Secondary | ICD-10-CM | POA: Insufficient documentation

## 2017-07-06 DIAGNOSIS — J0100 Acute maxillary sinusitis, unspecified: Secondary | ICD-10-CM | POA: Insufficient documentation

## 2017-07-06 DIAGNOSIS — J45909 Unspecified asthma, uncomplicated: Secondary | ICD-10-CM | POA: Insufficient documentation

## 2017-07-06 DIAGNOSIS — Z79899 Other long term (current) drug therapy: Secondary | ICD-10-CM | POA: Insufficient documentation

## 2017-07-06 LAB — GLUCOSE, CAPILLARY: GLUCOSE-CAPILLARY: 136 mg/dL — AB (ref 65–99)

## 2017-07-06 MED ORDER — AMOXICILLIN-POT CLAVULANATE 875-125 MG PO TABS: 1.0000 | ORAL_TABLET | Freq: Two times a day (BID) | ORAL | 0 refills | Status: AC

## 2017-07-06 NOTE — ED Provider Notes (Signed)
[facility] Emergency Department Provider Note  ____________________________________________  Time seen: Approximately 3:18 PM  I have reviewed the triage vital signs and the nursing notes.   HISTORY  Chief Complaint Otalgia and Dental Pain    HPI [name] is a 26 y.o. female presents to the emergency department with maxillary sinus tenderness, myalgias and dental pain for the past 3 days.  Patient has had intermittent rhinorrhea and congestion.  Patient denies seeing broken teeth.  She denies fever and chills.  No alleviating measures have been attempted.   Past Medical History:  Diagnosis Date  . Anemia   . Anxiety   . Asthma   . Chlamydia   . Enlarged thyroid   . Headache   . UTI (urinary tract infection) during pregnancy     Patient Active Problem List   Diagnosis Date Noted  . Suicidal ideation 09/18/2015  . Panic disorder 09/18/2015  . Adjustment disorder with anxiety 09/18/2015  . Rectus sheath hematoma 02/28/2015  . S/P cesarean section 02/22/2015  . Supervision of normal pregnancy 02/16/2015  . DISORDER, DEPRESSIVE NEC 05/04/2007  . History of other specified conditions presenting hazards to health 04/30/2007  . TOBACCO ABUSE 12/31/2006  . MIGRAINE-COMMON 10/09/2006  . PMS 10/02/2006    Past Surgical History:  Procedure Laterality Date  . CESAREAN SECTION  2010  . CESAREAN SECTION WITH BILATERAL TUBAL LIGATION Bilateral 02/22/2015   Procedure: CESAREAN SECTION WITH BILATERAL TUBAL LIGATION;  Surgeon: Catalina AntiguaPeggy Constant, MD;  Location: [facility];  Service: Obstetrics;  Laterality: Bilateral;  . DILATATION AND CURRETAGE    . DILATION AND EVACUATION  04/01/2011   Procedure: DILATATION AND EVACUATION (D&E);  Surgeon: Almon HerculesKendra H. Ross;  Location: [facility];  Service: Gynecology;  Laterality: N/A;  . TONSILLECTOMY  2009  . TYMPANOSTOMY TUBE PLACEMENT      Prior to Admission medications   Medication Sig Start Date End Date Taking?  Authorizing Provider  acetaminophen (TYLENOL) 500 MG tablet Take 500 mg by mouth every 6 (six) hours as needed for mild pain or headache.     [provider]  albuterol (PROVENTIL HFA;VENTOLIN HFA) 108 (90 BASE) MCG/ACT inhaler Inhale 1-2 puffs into the lungs every 6 (six) hours as needed for wheezing. 02/25/15   Montez MoritaLawson, Marie D, CNM  albuterol (PROVENTIL) (2.5 MG/3ML) 0.083% nebulizer solution Inhale 3 mLs into the lungs every 6 (six) hours as needed for wheezing. 02/25/15   Montez MoritaLawson, Marie D, CNM  amoxicillin-clavulanate (AUGMENTIN) 875-125 MG tablet Take 1 tablet by mouth 2 (two) times daily for 10 days. 07/06/17 07/16/17  Orvil FeilWoods, Redina Zeller M, PA-C  ferrous sulfate (FERROUSUL) 325 (65 FE) MG tablet Take 1 tablet (325 mg total) by mouth 2 (two) times daily. Patient not taking: Reported on 02/28/2015 02/25/15   Katrinka BlazingSmith, [place], CNM  HYDROcodone-acetaminophen (NORCO/VICODIN) 5-325 MG tablet Take 1-2 tablets by mouth every 4 (four) hours as needed for moderate pain. 05/07/16   Loleta RoseForbach, Cory, MD  ibuprofen (ADVIL,MOTRIN) 600 MG tablet Take 1 tablet (600 mg total) by mouth every 6 (six) hours as needed. 02/28/15   Aviva SignsWilliams, Marie L, CNM  metroNIDAZOLE (FLAGYL) 500 MG tablet Take 1 tablet (500 mg total) by mouth 2 (two) times daily. 09/28/15   Emily FilbertWilliams, Jonathan E, MD  metroNIDAZOLE (FLAGYL) 500 MG tablet Take 1 tablet (500 mg total) by mouth 2 (two) times daily. 04/17/16   Emily FilbertWilliams, Jonathan E, MD  nitrofurantoin, macrocrystal-monohydrate, (MACROBID) 100 MG capsule Take 100 mg by mouth 2 (two) times daily. 02/26/15  [provider]  OVER THE COUNTER MEDICATION Take 1 tablet by mouth 3 (three) times daily. Herbal vitamin for breast feeding    [provider]  oxyCODONE-acetaminophen (PERCOCET/ROXICET) 5-325 MG per tablet Take 1-2 tablets by mouth every 6 (six) hours as needed. 02/28/15   Aviva SignsWilliams, Marie L, CNM  polyethylene glycol (MIRALAX / GLYCOLAX) packet Take 17 g by mouth 2 (two) times  daily as needed. Patient taking differently: Take 17 g by mouth 2 (two) times daily as needed for mild constipation.  02/25/15   Katrinka BlazingSmith, [place], CNM  prenatal vitamin w/FE, FA (PRENATAL 1 + 1) 27-1 MG TABS tablet Take 1 tablet by mouth daily at 12 noon.    [provider]    Allergies Patient has no known allergies.  Family History  Problem Relation Age of Onset  . Depression Mother   . Hyperlipidemia Mother   . Diabetes Mother   . Anesthesia problems Neg Hx     Social History Social History   Tobacco Use  . Smoking status: Current Every Day Smoker    Packs/day: 1.00    Years: 3.00    Pack years: 3.00    Types: Cigarettes  . Smokeless tobacco: Never Used  Substance Use Topics  . Alcohol use: Yes  . Drug use: No     Review of Systems  Constitutional: No fever/chills Eyes: No visual changes. No discharge ENT: Patient has maxillary and frontal sinus tenderness. Cardiovascular: no chest pain. Respiratory: no cough. No SOB.  Right Genitourinary: Negative for dysuria. No hematuria Musculoskeletal: Negative for musculoskeletal pain. Skin: Negative for rash, abrasions, lacerations, ecchymosis. Neurological: Negative for headaches, focal weakness or numbness.   ____________________________________________   PHYSICAL EXAM:  VITAL SIGNS: ED Triage Vitals  Enc Vitals Group     BP 07/06/17 1359 112/72     Pulse Rate 07/06/17 1359 71     Resp 07/06/17 1359 16     Temp 07/06/17 1359 98.3 F (36.8 C)     Temp src --      SpO2 07/06/17 1359 99 %     Weight 07/06/17 1400 124 lb (56.2 kg)     Height 07/06/17 1400 5\' 3"  (1.6 m)     Head Circumference --      Peak Flow --      Pain Score 07/06/17 1359 10     Pain Loc --      Pain Edu? --      Excl. in GC? --      Constitutional: Alert and oriented. Well appearing and in no acute distress. Eyes: Conjunctivae are normal. PERRL. EOMI. Head: Atraumatic.  Patient has maxillary and frontal sinus  tenderness. ENT:      Ears: Right tympanic membrane is injected.  Left tympanic membrane is pearly.      Nose: No congestion/rhinnorhea.      Mouth/Throat: Mucous membranes are moist.  Neck: No stridor.  Full range of motion. Hematological/Lymphatic/Immunilogical: No cervical lymphadenopathy. Cardiovascular: Normal rate, regular rhythm. Normal S1 and S2.  Good peripheral circulation. Respiratory: Normal respiratory effort without tachypnea or retractions. Lungs CTAB. Good air entry to the bases with no decreased or absent breath sounds. Musculoskeletal: Full range of motion to all extremities. No gross deformities appreciated. Neurologic:  Normal speech and language. No gross focal neurologic deficits are appreciated.  Skin:  Skin is warm, dry and intact. No rash noted. Psychiatric: Mood and affect are normal. Speech and behavior are normal. Patient exhibits appropriate insight and judgement.  ____________________________________________   LABS (all labs ordered are listed, but only abnormal results are displayed)  Labs Reviewed  GLUCOSE, CAPILLARY - Abnormal; Notable for the following components:      Result Value   Glucose-Capillary 136 (*)    All other components within normal limits   ____________________________________________  EKG   ____________________________________________  RADIOLOGY   No results found.  ____________________________________________    PROCEDURES  Procedure(s) performed:    Procedures    Medications - No data to display   ____________________________________________   INITIAL IMPRESSION / ASSESSMENT AND PLAN / ED COURSE  Pertinent labs & imaging results that were available during my care of the patient were reviewed by me and considered in my medical decision making (see chart for details).  Review of the [place] CSRS was performed in accordance of the [facility] prior to dispensing any controlled drugs.     Assessment and  plan Sinusitis Patient presents to the emergency department with dental pain, myalgias and maxillary sinus tenderness.  Differential diagnosis included sinusitis versus broken tooth versus upper respiratory tract infection.  History and physical exam findings are consistent with sinusitis.  Patient was discharged with Augmentin and advised to follow-up with primary care as needed.  All patient questions were answered.    ____________________________________________  FINAL CLINICAL IMPRESSION(S) / ED DIAGNOSES  Final diagnoses:  Acute maxillary sinusitis, recurrence not specified      NEW MEDICATIONS STARTED DURING THIS VISIT:  ED Discharge Orders        Ordered    amoxicillin-clavulanate (AUGMENTIN) 875-125 MG tablet  2 times daily     07/06/17 1519          This chart was dictated using voice recognition software/Dragon. Despite best efforts to proofread, errors can occur which can change the meaning. Any change was purely unintentional.    Orvil Feil, PA-C 07/06/17 1603    Sharman Cheek, MD 07/06/17 2312

## 2017-07-06 NOTE — ED Triage Notes (Signed)
Pt brought a list with her complaints - mostly dental related and bv symptoms. vss

## 2018-08-26 LAB — HM HIV SCREENING LAB: HM HIV Screening: NEGATIVE

## 2018-09-01 ENCOUNTER — Encounter: Payer: Self-pay | Admitting: Obstetrics and Gynecology

## 2018-09-09 ENCOUNTER — Encounter: Payer: Self-pay | Admitting: Obstetrics and Gynecology

## 2018-09-18 ENCOUNTER — Encounter: Payer: Self-pay | Admitting: Obstetrics and Gynecology

## 2018-10-06 ENCOUNTER — Encounter: Payer: Self-pay | Admitting: Obstetrics and Gynecology

## 2019-02-15 ENCOUNTER — Encounter: Payer: Self-pay | Admitting: Physician Assistant

## 2019-02-16 ENCOUNTER — Encounter: Payer: Self-pay | Admitting: Physician Assistant

## 2019-02-25 ENCOUNTER — Ambulatory Visit: Payer: Self-pay

## 2019-03-16 ENCOUNTER — Ambulatory Visit: Payer: Medicaid Other

## 2019-03-22 ENCOUNTER — Encounter: Payer: Self-pay | Admitting: Physician Assistant

## 2019-03-22 ENCOUNTER — Ambulatory Visit: Payer: Medicaid Other | Admitting: Physician Assistant

## 2019-03-22 ENCOUNTER — Other Ambulatory Visit: Payer: Self-pay

## 2019-03-22 DIAGNOSIS — Z113 Encounter for screening for infections with a predominantly sexual mode of transmission: Secondary | ICD-10-CM | POA: Diagnosis not present

## 2019-03-22 LAB — WET PREP FOR TRICH, YEAST, CLUE
Trichomonas Exam: NEGATIVE
Yeast Exam: NEGATIVE

## 2019-03-22 NOTE — Progress Notes (Signed)
    STI clinic/screening visit  Subjective:  [name] is a 28 y.o. female being seen today for an STI screening visit. The patient reports they do not have symptoms.  Patient has the following medical conditions:   Patient Active Problem List   Diagnosis Date Noted  . Suicidal ideation 09/18/2015  . Panic disorder 09/18/2015  . Adjustment disorder with anxiety 09/18/2015  . Rectus sheath hematoma 02/28/2015  . S/P cesarean section 02/22/2015  . Supervision of normal pregnancy 02/16/2015  . DISORDER, DEPRESSIVE NEC 05/04/2007  . History of other specified conditions presenting hazards to health 04/30/2007  . TOBACCO ABUSE 12/31/2006  . MIGRAINE-COMMON 10/09/2006  . PMS 10/02/2006     Chief Complaint  Patient presents with  . SEXUALLY TRANSMITTED DISEASE    HPI  Patient reports that she does not have any symptoms but would like screening to make sure everything is OK.  Declines blood work today.   See flowsheet for further details and programmatic requirements.    The following portions of the patient's history were reviewed and updated as appropriate: allergies, current medications, past medical history, past social history, past surgical history and problem list.  Objective:  There were no vitals filed for this visit.  Physical Exam Constitutional:      General: She is not in acute distress.    Appearance: Normal appearance.  HENT:     Head: Normocephalic and atraumatic.     Mouth/Throat:     Mouth: Mucous membranes are moist.     Pharynx: Oropharynx is clear. No oropharyngeal exudate or posterior oropharyngeal erythema.  Neck:     Musculoskeletal: Neck supple.  Pulmonary:     Effort: Pulmonary effort is normal.  Abdominal:     Palpations: Abdomen is soft. There is no mass.     Tenderness: There is no abdominal tenderness. There is no guarding or rebound.  Genitourinary:    General: Normal vulva.     Rectum: Normal.     Comments: External  genitalia/pubic are is without nits, lice, edema, erythema, lesions and inguinal adenopathy. Vagina a with normal mucosa and discharge, pH= 4.5 Cervix without visible lesions. Uterus normal size, firm, mobile, nt, no masses, no CMT, no adnexal tenderness or fullness.  Lymphadenopathy:     Cervical: No cervical adenopathy.  Skin:    General: Skin is warm and dry.     Findings: No bruising, erythema, lesion or rash.  Neurological:     Mental Status: She is alert and oriented to person, place, and time.  Psychiatric:        Mood and Affect: Mood normal.        Behavior: Behavior normal.        Thought Content: Thought content normal.        Judgment: Judgment normal.       Assessment and Plan:  [name] is a 28 y.o. female presenting to the [facility] Department for STI screening  1. Screening for STD (sexually transmitted disease) Patient is without symptoms today and declines blood work. Reviewed wet mount and no treatment is indicated today. Rec condoms with all sex Await test results.  Counseled that RN will call if needs to RTC for any treatment once results are back.  - WET PREP FOR [place], [facility], [facility]     No follow-ups on file.  No future appointments.  Jerene Dilling, PA

## 2019-08-02 ENCOUNTER — Ambulatory Visit: Payer: HRSA Program | Attending: Internal Medicine

## 2019-08-02 DIAGNOSIS — Z20828 Contact with and (suspected) exposure to other viral communicable diseases: Secondary | ICD-10-CM | POA: Insufficient documentation

## 2019-08-02 DIAGNOSIS — Z20822 Contact with and (suspected) exposure to covid-19: Secondary | ICD-10-CM

## 2019-08-04 ENCOUNTER — Telehealth: Payer: Self-pay | Admitting: *Deleted

## 2019-08-04 LAB — NOVEL CORONAVIRUS, NAA: SARS-CoV-2, NAA: NOT DETECTED

## 2019-08-11 ENCOUNTER — Ambulatory Visit: Payer: Medicaid Other | Attending: Internal Medicine

## 2019-08-11 DIAGNOSIS — Z20822 Contact with and (suspected) exposure to covid-19: Secondary | ICD-10-CM

## 2019-08-13 LAB — NOVEL CORONAVIRUS, NAA: SARS-CoV-2, NAA: NOT DETECTED

## 2019-10-05 ENCOUNTER — Other Ambulatory Visit: Payer: Self-pay

## 2019-10-05 ENCOUNTER — Encounter: Payer: Self-pay | Admitting: Advanced Practice Midwife

## 2019-10-05 ENCOUNTER — Ambulatory Visit (INDEPENDENT_AMBULATORY_CARE_PROVIDER_SITE_OTHER): Payer: Medicaid Other | Admitting: Advanced Practice Midwife

## 2019-10-05 ENCOUNTER — Other Ambulatory Visit (HOSPITAL_COMMUNITY)
Admission: RE | Admit: 2019-10-05 | Discharge: 2019-10-05 | Disposition: A | Discharge status: Home / Self Care | Payer: Medicaid Other | Source: Ambulatory Visit | Attending: Advanced Practice Midwife | Admitting: Advanced Practice Midwife

## 2019-10-05 VITALS — BP 122/70 | Ht 63.5 in | Wt 143.0 lb

## 2019-10-05 DIAGNOSIS — Z0000 Encounter for general adult medical examination without abnormal findings: Secondary | ICD-10-CM

## 2019-10-05 DIAGNOSIS — Z0001 Encounter for general adult medical examination with abnormal findings: Secondary | ICD-10-CM | POA: Diagnosis not present

## 2019-10-05 DIAGNOSIS — Z113 Encounter for screening for infections with a predominantly sexual mode of transmission: Secondary | ICD-10-CM

## 2019-10-05 DIAGNOSIS — Z01419 Encounter for gynecological examination (general) (routine) without abnormal findings: Secondary | ICD-10-CM

## 2019-10-05 DIAGNOSIS — Z124 Encounter for screening for malignant neoplasm of cervix: Secondary | ICD-10-CM | POA: Insufficient documentation

## 2019-10-05 DIAGNOSIS — N920 Excessive and frequent menstruation with regular cycle: Secondary | ICD-10-CM | POA: Diagnosis not present

## 2019-10-05 DIAGNOSIS — N946 Dysmenorrhea, unspecified: Secondary | ICD-10-CM

## 2019-10-05 DIAGNOSIS — Z01411 Encounter for gynecological examination (general) (routine) with abnormal findings: Secondary | ICD-10-CM | POA: Diagnosis not present

## 2019-10-05 DIAGNOSIS — Z716 Tobacco abuse counseling: Secondary | ICD-10-CM

## 2019-10-05 MED ORDER — BUPROPION HCL ER (SR) 150 MG PO TB12: 150.0000 mg | ORAL_TABLET | Freq: Every day | ORAL | 4 refills | Status: DC

## 2019-10-05 MED ORDER — ORILISSA 150 MG PO TABS: 150.0000 mg | ORAL_TABLET | Freq: Every day | ORAL | 6 refills | Status: DC

## 2019-10-05 NOTE — Patient Instructions (Signed)
Endometriosis  Endometriosis is a condition in which the tissue that lines the uterus (endometrium) grows outside of its normal location. The tissue may grow in many locations close to the uterus, but it commonly grows on the ovaries, fallopian tubes, vagina, or bowel. When the uterus sheds the endometrium every menstrual cycle, there is bleeding wherever the endometrial tissue is located. This can cause pain because blood is irritating to tissues that are not normally exposed to it. What are the causes? The cause of endometriosis is not known. What increases the risk? You may be more likely to develop endometriosis if you:  Have a family history of endometriosis.  Have never given birth.  Started your period at age 10 or younger.  Have high levels of estrogen in your body.  Were exposed to a certain medicine (diethylstilbestrol) before you were born (in utero).  Had low birth weight.  Were born as a twin, triplet, or other multiple.  Have a BMI of less than 25. BMI is an estimate of body fat and is calculated from height and weight. What are the signs or symptoms? Often, there are no symptoms of this condition. If you do have symptoms, they may:  Vary depending on where your endometrial tissue is growing.  Occur during your menstrual period (most common) or midcycle.  Come and go, or you may go months with no symptoms at all.  Stop with menopause. Symptoms may include:  Pain in the back or abdomen.  Heavier bleeding during periods.  Pain during sex.  Painful bowel movements.  Infertility.  Pelvic pain.  Bleeding more than once a month. How is this diagnosed? This condition is diagnosed based on your symptoms and a physical exam. You may have tests, such as:  Blood tests and urine tests. These may be done to help rule out other possible causes of your symptoms.  Ultrasound, to look for abnormal tissues.  An X-ray of the lower bowel (barium enema).  An  ultrasound that is done through the vagina (transvaginally).  CT scan.  MRI.  Laparoscopy. In this procedure, a lighted, pencil-sized instrument called a laparoscope is inserted into your abdomen through an incision. The laparoscope allows your health care provider to look at the organs inside your body and check for abnormal tissue to confirm the diagnosis. If abnormal tissue is found, your health care provider may remove a small piece of tissue (biopsy) to be examined under a microscope. How is this treated? Treatment for this condition may include:  Medicines to relieve pain, such as NSAIDs.  Hormone therapy. This involves using artificial (synthetic) hormones to reduce endometrial tissue growth. Your health care provider may recommend using a hormonal form of birth control, or other medicines.  Surgery. This may be done to remove abnormal endometrial tissue. ? In some cases, tissue may be removed using a laparoscope and a laser (laparoscopic laser treatment). ? In severe cases, surgery may be done to remove the fallopian tubes, uterus, and ovaries (hysterectomy). Follow these instructions at home:  Take over-the-counter and prescription medicines only as told by your health care provider.  Do not drive or use heavy machinery while taking prescription pain medicine.  Try to avoid activities that cause pain, including sexual activity.  Keep all follow-up visits as told by your health care provider. This is important. Contact a health care provider if:  You have pain in the area between your hip bones (pelvic area) that occurs: ? Before, during, or after your period. ?   In between your period and gets worse during your period. ? During or after sex. ? With bowel movements or urination, especially during your period.  You have problems getting pregnant.  You have a fever. Get help right away if:  You have severe pain that does not get better with medicine.  You have severe  nausea and vomiting, or you cannot eat without vomiting.  You have pain that affects only the lower, right side of your abdomen.  You have abdominal pain that gets worse.  You have abdominal swelling.  You have blood in your stool. This information is not intended to replace advice given to you by your health care provider. Make sure you discuss any questions you have with your health care provider. Document Revised: 07/04/2017 Document Reviewed: 12/23/2015 Elsevier Patient Education  2020 [employer]. Coping with Quitting Smoking  Quitting smoking is a physical and mental challenge. You will face cravings, withdrawal symptoms, and temptation. Before quitting, work with your health care provider to make a plan that can help you cope. Preparation can help you quit and keep you from giving in. How can I cope with cravings? Cravings usually last for 5-10 minutes. If you get through it, the craving will pass. Consider taking the following actions to help you cope with cravings:  Keep your mouth busy: ? Chew sugar-free gum. ? Suck on hard candies or a straw. ? Brush your teeth.  Keep your hands and body busy: ? Immediately change to a different activity when you feel a craving. ? Squeeze or play with a ball. ? Do an activity or a hobby, like making bead jewelry, practicing needlepoint, or working with wood. ? Mix up your normal routine. ? Take a short exercise break. Go for a quick walk or run up and down stairs. ? Spend time in public places where smoking is not allowed.  Focus on doing something kind or helpful for someone else.  Call a friend or family member to talk during a craving.  Join a support group.  Call a quit line, such as 1-800-QUIT-NOW.  Talk with your health care provider about medicines that might help you cope with cravings and make quitting easier for you. How can I deal with withdrawal symptoms? Your body may experience negative effects as it tries to get  used to not having nicotine in the system. These effects are called withdrawal symptoms. They may include:  Feeling hungrier than normal.  Trouble concentrating.  Irritability.  Trouble sleeping.  Feeling depressed.  Restlessness and agitation.  Craving a cigarette. To manage withdrawal symptoms:  Avoid places, people, and activities that trigger your cravings.  Remember why you want to quit.  Get plenty of sleep.  Avoid coffee and other caffeinated drinks. These may worsen some of your symptoms. How can I handle social situations? Social situations can be difficult when you are quitting smoking, especially in the first few weeks. To manage this, you can:  Avoid parties, bars, and other social situations where people might be smoking.  Avoid alcohol.  Leave right away if you have the urge to smoke.  Explain to your family and friends that you are quitting smoking. Ask for understanding and support.  Plan activities with friends or family where smoking is not an option. What are some ways I can cope with stress? Wanting to smoke may cause stress, and stress can make you want to smoke. Find ways to manage your stress. Relaxation techniques can help. For example:  Breathe slowly and deeply, in through your nose and out through your mouth.  Listen to soothing, relaxing music.  Talk with a family member or friend about your stress.  Light a candle.  Soak in a bath or take a shower.  Think about a peaceful place. What are some ways I can prevent weight gain? Be aware that many people gain weight after they quit smoking. However, not everyone does. To keep from gaining weight, have a plan in place before you quit and stick to the plan after you quit. Your plan should include:  Having healthy snacks. When you have a craving, it may help to: ? Eat plain popcorn, crunchy carrots, celery, or other cut vegetables. ? Chew sugar-free gum.  Changing how you eat: ? Eat small  portion sizes at meals. ? Eat 4-6 small meals throughout the day instead of 1-2 large meals a day. ? Be mindful when you eat. Do not watch television or do other things that might distract you as you eat.  Exercising regularly: ? Make time to exercise each day. If you do not have time for a long workout, do short bouts of exercise for 5-10 minutes several times a day. ? Do some form of strengthening exercise, like weight lifting, and some form of aerobic exercise, like running or swimming.  Drinking plenty of water or other low-calorie or no-calorie drinks. Drink 6-8 glasses of water daily, or as much as instructed by your health care provider. Summary  Quitting smoking is a physical and mental challenge. You will face cravings, withdrawal symptoms, and temptation to smoke again. Preparation can help you as you go through these challenges.  You can cope with cravings by keeping your mouth busy (such as by chewing gum), keeping your body and hands busy, and making calls to family, friends, or a helpline for people who want to quit smoking.  You can cope with withdrawal symptoms by avoiding places where people smoke, avoiding drinks with caffeine, and getting plenty of rest.  Ask your health care provider about the different ways to prevent weight gain, avoid stress, and handle social situations. This information is not intended to replace advice given to you by your health care provider. Make sure you discuss any questions you have with your health care provider. Document Revised: 07/04/2017 Document Reviewed: 07/19/2016 Elsevier Patient Education  [place]. Bupropion sustained-release tablets (smoking cessation) What is this medicine? BUPROPION (byoo PROE pee on) is used to help people quit smoking. This medicine may be used for other purposes; ask your health care provider or pharmacist if you have questions. COMMON BRAND NAME(S): Buproban, Zyban What should I tell my health care  provider before I take this medicine? They need to know if you have any of these conditions:  an eating disorder, such as anorexia or bulimia  bipolar disorder or psychosis  diabetes or high blood sugar, treated with medication  glaucoma  head injury or brain tumor  heart disease, previous heart attack, or irregular heart beat  high blood pressure  kidney or liver disease  seizures  suicidal thoughts or a previous suicide attempt  Tourette's syndrome  weight loss  an unusual or allergic reaction to bupropion, other medicines, foods, dyes, or preservatives  breast-feeding  pregnant or trying to become pregnant How should I use this medicine? Take this medicine by mouth with a glass of water. Follow the directions on the prescription label. You can take it with or without food. If it upsets  your stomach, take it with food. Do not cut, crush or chew this medicine. Take your medicine at regular intervals. If you take this medicine more than once a day, take your second dose at least 8 hours after you take your first dose. To limit difficulty in sleeping, avoid taking this medicine at bedtime. Do not take your medicine more often than directed. Do not stop taking this medicine suddenly except upon the advice of your doctor. Stopping this medicine too quickly may cause serious side effects. A special MedGuide will be given to you by the pharmacist with each prescription and refill. Be sure to read this information carefully each time. Talk to your pediatrician regarding the use of this medicine in children. Special care may be needed. Overdosage: If you think you have taken too much of this medicine contact a poison control center or emergency room at once. NOTE: This medicine is only for you. Do not share this medicine with others. What if I miss a dose? If you miss a dose, skip the missed dose and take your next tablet at the regular time. There should be at least 8 hours between  doses. Do not take double or extra doses. What may interact with this medicine? Do not take this medicine with any of the following medications:  linezolid  MAOIs like Azilect, Carbex, Eldepryl, Marplan, Nardil, and Parnate  methylene blue (injected into a vein)  other medicines that contain bupropion like Wellbutrin This medicine may also interact with the following medications:  alcohol  certain medicines for anxiety or sleep  certain medicines for blood pressure like metoprolol, propranolol  certain medicines for depression or psychotic disturbances  certain medicines for HIV or AIDS like efavirenz, lopinavir, nelfinavir, ritonavir  certain medicines for irregular heart beat like propafenone, flecainide  certain medicines for Parkinson's disease like amantadine, levodopa  certain medicines for seizures like carbamazepine, phenytoin, phenobarbital  cimetidine  clopidogrel  cyclophosphamide  digoxin  furazolidone  isoniazid  nicotine  orphenadrine  procarbazine  steroid medicines like prednisone or cortisone  stimulant medicines for attention disorders, weight loss, or to stay awake  tamoxifen  theophylline  thiotepa  ticlopidine  tramadol  warfarin This list may not describe all possible interactions. Give your health care provider a list of all the medicines, herbs, non-prescription drugs, or dietary supplements you use. Also tell them if you smoke, drink alcohol, or use illegal drugs. Some items may interact with your medicine. What should I watch for while using this medicine? Visit your doctor or healthcare provider for regular checks on your progress. This medicine should be used together with a patient support program. It is important to participate in a behavioral program, counseling, or other support program that is recommended by your healthcare provider. This medicine may cause serious skin reactions. They can happen weeks to months after  starting the medicine. Contact your healthcare provider right away if you notice fevers or flu-like symptoms with a rash. The rash may be red or purple and then turn into blisters or peeling of the skin. Or, you might notice a red rash with swelling of the face, lips or lymph nodes in your neck or under your arms. Patients and their families should watch out for new or worsening thoughts of suicide or depression. Also watch out for sudden changes in feelings such as feeling anxious, agitated, panicky, irritable, hostile, aggressive, impulsive, severely restless, overly excited and hyperactive, or not being able to sleep. If this happens, especially at the  beginning of treatment or after a change in dose, call your healthcare provider. Avoid alcoholic drinks while taking this medicine. Drinking excessive alcoholic beverages, using sleeping or anxiety medicines, or quickly stopping the use of these agents while taking this medicine may increase your risk for a seizure. Do not drive or use heavy machinery until you know how this medicine affects you. This medicine can impair your ability to perform these tasks. Do not take this medicine close to bedtime. It may prevent you from sleeping. Your mouth may get dry. Chewing sugarless gum or sucking hard candy, and drinking plenty of water may help. Contact your doctor if the problem does not go away or is severe. Do not use nicotine patches or chewing gum without the advice of your doctor or healthcare provider while taking this medicine. You may need to have your blood pressure taken regularly if your doctor recommends that you use both nicotine and this medicine together. What side effects may I notice from receiving this medicine? Side effects that you should report to your doctor or health care professional as soon as possible:  allergic reactions like skin rash, itching or hives, swelling of the face, lips, or tongue  breathing problems  changes in  vision  confusion  elevated mood, decreased need for sleep, racing thoughts, impulsive behavior  fast or irregular heartbeat  hallucinations, loss of contact with reality  increased blood pressure  rash, fever, and swollen lymph nodes  redness, blistering, peeling, or loosening of the skin, including inside the mouth  seizures  suicidal thoughts or other mood changes  unusually weak or tired  vomiting Side effects that usually do not require medical attention (report to your doctor or health care professional if they continue or are bothersome):  constipation  headache  loss of appetite  nausea  tremors  weight loss This list may not describe all possible side effects. Call your doctor for medical advice about side effects. You may report side effects to FDA at 1-800-FDA-1088. Where should I keep my medicine? Keep out of the reach of children. Store at room temperature between 20 and 25 degrees C (68 and 77 degrees F). Protect from light. Keep container tightly closed. Throw away any unused medicine after the expiration date. NOTE: This sheet is a summary. It may not cover all possible information. If you have questions about this medicine, talk to your doctor, pharmacist, or health care provider.  2020 Elsevier/Gold Standard (2018-10-15 13:59:09) Health Maintenance, Female Adopting a healthy lifestyle and getting preventive care are important in promoting health and wellness. Ask your health care provider about:  The right schedule for you to have regular tests and exams.  Things you can do on your own to prevent diseases and keep yourself healthy. What should I know about diet, weight, and exercise? Eat a healthy diet   Eat a diet that includes plenty of vegetables, fruits, low-fat dairy products, and lean protein.  Do not eat a lot of foods that are high in solid fats, added sugars, or sodium. Maintain a healthy weight Body mass index (BMI) is used to  identify weight problems. It estimates body fat based on height and weight. Your health care provider can help determine your BMI and help you achieve or maintain a healthy weight. Get regular exercise Get regular exercise. This is one of the most important things you can do for your health. Most adults should:  Exercise for at least 150 minutes each week. The exercise should increase  your heart rate and make you sweat (moderate-intensity exercise).  Do strengthening exercises at least twice a week. This is in addition to the moderate-intensity exercise.  Spend less time sitting. Even light physical activity can be beneficial. Watch cholesterol and blood lipids Have your blood tested for lipids and cholesterol at 29 years of age, then have this test every 5 years. Have your cholesterol levels checked more often if:  Your lipid or cholesterol levels are high.  You are older than 29 years of age.  You are at high risk for heart disease. What should I know about cancer screening? Depending on your health history and family history, you may need to have cancer screening at various ages. This may include screening for:  Breast cancer.  Cervical cancer.  Colorectal cancer.  Skin cancer.  Lung cancer. What should I know about heart disease, diabetes, and high blood pressure? Blood pressure and heart disease  High blood pressure causes heart disease and increases the risk of stroke. This is more likely to develop in people who have high blood pressure readings, are of African descent, or are overweight.  Have your blood pressure checked: ? Every 3-5 years if you are 62-27 years of age. ? Every year if you are 57 years old or older. Diabetes Have regular diabetes screenings. This checks your fasting blood sugar level. Have the screening done:  Once every three years after age 54 if you are at a normal weight and have a low risk for diabetes.  More often and at a younger age if you  are overweight or have a high risk for diabetes. What should I know about preventing infection? Hepatitis B If you have a higher risk for hepatitis B, you should be screened for this virus. Talk with your health care provider to find out if you are at risk for hepatitis B infection. Hepatitis C Testing is recommended for:  Everyone born from [birth year] through 1965.  Anyone with known risk factors for hepatitis C. Sexually transmitted infections (STIs)  Get screened for STIs, including gonorrhea and chlamydia, if: ? You are sexually active and are younger than 29 years of age. ? You are older than 29 years of age and your health care provider tells you that you are at risk for this type of infection. ? Your sexual activity has changed since you were last screened, and you are at increased risk for chlamydia or gonorrhea. Ask your health care provider if you are at risk.  Ask your health care provider about whether you are at high risk for HIV. Your health care provider may recommend a prescription medicine to help prevent HIV infection. If you choose to take medicine to prevent HIV, you should first get tested for HIV. You should then be tested every 3 months for as long as you are taking the medicine. Pregnancy  If you are about to stop having your period (premenopausal) and you may become pregnant, seek counseling before you get pregnant.  Take 400 to 800 micrograms (mcg) of folic acid every day if you become pregnant.  Ask for birth control (contraception) if you want to prevent pregnancy. Osteoporosis and menopause Osteoporosis is a disease in which the bones lose minerals and strength with aging. This can result in bone fractures. If you are 21 years old or older, or if you are at risk for osteoporosis and fractures, ask your health care provider if you should:  Be screened for bone loss.  Take a calcium  or vitamin D supplement to lower your risk of fractures.  Be given hormone  replacement therapy (HRT) to treat symptoms of menopause. Follow these instructions at home: Lifestyle  Do not use any products that contain nicotine or tobacco, such as cigarettes, e-cigarettes, and chewing tobacco. If you need help quitting, ask your health care provider.  Do not use street drugs.  Do not share needles.  Ask your health care provider for help if you need support or information about quitting drugs. Alcohol use  Do not drink alcohol if: ? Your health care provider tells you not to drink. ? You are pregnant, may be pregnant, or are planning to become pregnant.  If you drink alcohol: ? Limit how much you use to 0-1 drink a day. ? Limit intake if you are breastfeeding.  Be aware of how much alcohol is in your drink. In the [place] one drink equals one 12 oz bottle of beer (355 mL), one 5 oz glass of wine (148 mL), or one 1 oz glass of hard liquor (44 mL). General instructions  Schedule regular health, dental, and eye exams.  Stay current with your vaccines.  Tell your health care provider if: ? You often feel depressed. ? You have ever been abused or do not feel safe at home. Summary  Adopting a healthy lifestyle and getting preventive care are important in promoting health and wellness.  Follow your health care provider's instructions about healthy diet, exercising, and getting tested or screened for diseases.  Follow your health care provider's instructions on monitoring your cholesterol and blood pressure. This information is not intended to replace advice given to you by your health care provider. Make sure you discuss any questions you have with your health care provider. Document Revised: 07/15/2018 Document Reviewed: 07/15/2018 Elsevier Patient Education  2020 [employer]

## 2019-10-06 LAB — CBC WITH DIFFERENTIAL/PLATELET
Basophils Absolute: 0.1 10*3/uL (ref 0.0–0.2)
Basos: 1 %
EOS (ABSOLUTE): 0.3 10*3/uL (ref 0.0–0.4)
Eos: 4 %
Hematocrit: 43.3 % (ref 34.0–46.6)
Hemoglobin: 15 g/dL (ref 11.1–15.9)
Immature Grans (Abs): 0 10*3/uL (ref 0.0–0.1)
Immature Granulocytes: 0 %
Lymphocytes Absolute: 3.2 10*3/uL — ABNORMAL HIGH (ref 0.7–3.1)
Lymphs: 35 %
MCH: 33 pg (ref 26.6–33.0)
MCHC: 34.6 g/dL (ref 31.5–35.7)
MCV: 95 fL (ref 79–97)
Monocytes Absolute: 0.6 10*3/uL (ref 0.1–0.9)
Monocytes: 6 %
Neutrophils Absolute: 4.9 10*3/uL (ref 1.4–7.0)
Neutrophils: 54 %
Platelets: 246 10*3/uL (ref 150–450)
RBC: 4.55 x10E6/uL (ref 3.77–5.28)
RDW: 11.7 % (ref 11.7–15.4)
WBC: 9.1 10*3/uL (ref 3.4–10.8)

## 2019-10-06 LAB — HGB A1C W/O EAG: Hgb A1c MFr Bld: 4.9 % (ref 4.8–5.6)

## 2019-10-06 LAB — TSH: TSH: 0.743 u[IU]/mL (ref 0.450–4.500)

## 2019-10-06 LAB — LIPID PANEL WITH LDL/HDL RATIO
Cholesterol, Total: 149 mg/dL (ref 100–199)
HDL: 61 mg/dL (ref >39)
LDL Chol Calc (NIH): 75 mg/dL (ref 0–99)
LDL/HDL Ratio: 1.2 ratio (ref 0.0–3.2)
Triglycerides: 64 mg/dL (ref 0–149)
VLDL Cholesterol Cal: 13 mg/dL (ref 5–40)

## 2019-10-07 LAB — CYTOLOGY - PAP
Chlamydia: NEGATIVE
Comment: NEGATIVE
Comment: NEGATIVE
Comment: NORMAL
Diagnosis: NEGATIVE
Neisseria Gonorrhea: NEGATIVE
Trichomonas: NEGATIVE

## 2019-10-08 NOTE — Progress Notes (Signed)
Gynecology Annual Exam   Date of Service: 10/05/2019  PCP: Patient, No Pcp Per  Chief Complaint:  Chief Complaint  Patient presents with   Annual Exam    History of Present Illness: Patient is a 29 y.o. [id number] presents for annual exam. The patient has complaints today of heavy and painful periods that have worsened since having cesarean sections. She reports a family history of endometriosis. We discussed possible treatment options ranging from hormones to [place] to surgical options. She would like to try [place].  She is also interested in quitting smoking and would like medication help. Counseling given. She will try Wellbutrin.  LMP: Patient's last menstrual period was 10/03/2019. Average Interval: regular, 28 days Duration of flow: 5 days Heavy Menses: yes Clots: no Intermenstrual Bleeding: no Postcoital Bleeding: no Dysmenorrhea: yes  The patient is sexually active. She currently uses tubal ligation for contraception. She denies dyspareunia.  The patient does perform self breast exams.  There is no notable family history of breast or ovarian cancer in her family.  The patient wears seatbelts: yes.   The patient has regular exercise: She does squats/leg exercises and lifts weights. She admits healthy diet and needing to increase hydration. She takes melatonin to help her sleep.    The patient denies current symptoms of depression.  She admits anxiety and has not been taking medication.   Review of Systems: Review of Systems  Constitutional: Negative.   HENT: Negative.   Eyes: Negative.   Respiratory: Negative.   Cardiovascular: Negative.   Gastrointestinal: Negative.   Genitourinary: Negative.        Painful heavy periods  Musculoskeletal: Negative.   Skin: Negative.   Neurological: Negative.   Endo/Heme/Allergies: Negative.   Psychiatric/Behavioral:       Anxiety     Past Medical History:  Patient Active Problem List   Diagnosis Date Noted   Suicidal  ideation 09/18/2015   Panic disorder 09/18/2015   Adjustment disorder with anxiety 09/18/2015   Rectus sheath hematoma 02/28/2015    18cm long x 96mm deep    S/P cesarean section 02/22/2015   DISORDER, DEPRESSIVE NEC 05/04/2007    Qualifier: Diagnosis of  By: Mellody Drown MD, [facility]      History of other specified conditions presenting hazards to health 04/30/2007    Qualifier: Diagnosis of  By: Unk Lightning MD, [place] 12/31/2006    Qualifier: Diagnosis of  By: Lorne Skeens MD, [facility]      MIGRAINE-COMMON 10/09/2006    Qualifier: Diagnosis of  By: Laurance Flatten CMA, [place]      PMS 10/02/2006    Qualifier: Diagnosis of  By: Dennard Schaumann MD, [facility]       Past Surgical History:  Past Surgical History:  Procedure Laterality Date   CESAREAN SECTION  2010   CESAREAN SECTION WITH BILATERAL TUBAL LIGATION Bilateral 02/22/2015   Procedure: CESAREAN SECTION WITH BILATERAL TUBAL LIGATION;  Surgeon: Mora Bellman, MD;  Location: [facility];  Service: Obstetrics;  Laterality: Bilateral;   DILATATION AND CURRETAGE     DILATION AND EVACUATION  04/01/2011   Procedure: DILATATION AND EVACUATION (D&E);  Surgeon: Marcial Pacas;  Location: [facility];  Service: Gynecology;  Laterality: N/A;   TONSILLECTOMY  2009   TYMPANOSTOMY TUBE PLACEMENT      Gynecologic History:  Patient's last menstrual period was 10/03/2019. Contraception: tubal ligation Last Pap: about 5 years ago Results were: no abnormalities   Obstetric History: [id number]  Family History:  Family History  Problem Relation Age of Onset   Depression Mother    Hyperlipidemia Mother    Diabetes Mother    Heart failure Mother    Anesthesia problems Neg Hx     Social History:  Social History   Socioeconomic History   Marital status: Single    Spouse name: Not on file   Number of children: Not on file   Years of education: Not on file   Highest education level: Not on file  Occupational  History   Not on file  Tobacco Use   Smoking status: Current Every Day Smoker    Packs/day: 1.00    Years: 3.00    Pack years: 3.00    Types: Cigarettes   Smokeless tobacco: Never Used  Substance and Sexual Activity   Alcohol use: Yes   Drug use: Yes    Types: Marijuana   Sexual activity: Yes    Birth control/protection: Surgical    Comment: has had BTL  Other Topics Concern   Not on file  Social History Narrative   Not on file   Social Determinants of Health   Financial Resource Strain:    Difficulty of Paying Living Expenses: Not on file  Food Insecurity:    Worried About Programme researcher, broadcasting/film/video in the Last Year: Not on file   [employer] of Food in the Last Year: Not on file  Transportation Needs:    Lack of Transportation (Medical): Not on file   Lack of Transportation (Non-Medical): Not on file  Physical Activity:    Days of Exercise per Week: Not on file   Minutes of Exercise per Session: Not on file  Stress:    Feeling of Stress : Not on file  Social Connections:    Frequency of Communication with Friends and Family: Not on file   Frequency of Social Gatherings with Friends and Family: Not on file   Attends Religious Services: Not on file   Active Member of Clubs or Organizations: Not on file   Attends Banker Meetings: Not on file   Marital Status: Not on file  Intimate Partner Violence:    Fear of Current or Ex-Partner: Not on file   Emotionally Abused: Not on file   Physically Abused: Not on file   Sexually Abused: Not on file    Allergies:  No Known Allergies  Medications: Prior to Admission medications   Medication Sig Start Date End Date Taking? Authorizing Provider  acetaminophen (TYLENOL) 500 MG tablet Take 500 mg by mouth every 6 (six) hours as needed for mild pain or headache.     [provider]  buPROPion (WELLBUTRIN SR) 150 MG 12 hr tablet Take 1 tablet (150 mg total) by mouth daily. for 3 days then  increase to 150 mg every 12 hours 10/05/19   Tresea Mall, CNM  Elagolix Sodium (ORILISSA) 150 MG TABS Take 150 mg by mouth daily. 10/05/19   Tresea Mall, CNM  ferrous sulfate (FERROUSUL) 325 (65 FE) MG tablet Take 1 tablet (325 mg total) by mouth 2 (two) times daily. Patient not taking: Reported on 02/28/2015 02/25/15   Katrinka Blazing, [place], CNM  OVER THE COUNTER MEDICATION Take 1 tablet by mouth 3 (three) times daily. Herbal vitamin for breast feeding    [provider]  polyethylene glycol (MIRALAX / GLYCOLAX) packet Take 17 g by mouth 2 (two) times daily as needed. Patient not taking: Reported on 10/05/2019 02/25/15   Dorathy Kinsman, CNM  prenatal  vitamin w/FE, FA (PRENATAL 1 + 1) 27-1 MG TABS tablet Take 1 tablet by mouth daily at 12 noon.    [provider]  diphenhydrAMINE (BENADRYL) 25 MG tablet Take 1 tablet (25 mg total) by mouth every 6 (six) hours as needed for itching. 01/18/12 07/01/12  Earley Favor, NP    Physical Exam Vitals: Blood pressure 122/70, height 5' 3.5" (1.613 m), weight 143 lb (64.9 kg), last menstrual period 10/03/2019  General: NAD HEENT: normocephalic, anicteric Thyroid: no enlargement, no palpable nodules Pulmonary: No increased work of breathing, CTAB Cardiovascular: RRR, distal pulses 2+ Breast: Breast symmetrical, no tenderness, no palpable nodules or masses, no skin or nipple retraction present, no nipple discharge.  No axillary or supraclavicular lymphadenopathy. Abdomen: NABS, soft, non-tender, non-distended.  Umbilicus without lesions.  No hepatomegaly, splenomegaly or masses palpable. No evidence of hernia  Genitourinary:  External: Normal external female genitalia.  Normal urethral meatus, normal Bartholin's and Skene's glands.    Vagina: Normal vaginal mucosa, no evidence of prolapse.    Cervix: Grossly normal in appearance, no bleeding, no CMT  Uterus: Non-enlarged, mobile, normal contour.    Adnexa: ovaries non-enlarged, no adnexal  masses  Rectal: deferred  Lymphatic: no evidence of inguinal lymphadenopathy Extremities: no edema, erythema, or tenderness Neurologic: Grossly intact Psychiatric: mood appropriate, affect full    Assessment: 29 y.o. [id number] routine annual exam  Plan: Problem List Items Addressed This Visit    None    Visit Diagnoses    Well woman exam with routine gynecological exam    -  Primary   Relevant Orders   Cytology - PAP (Completed)   CBC with Differential/Platelet (Completed)   TSH (Completed)   Lipid Panel With LDL/HDL Ratio (Completed)   Hgb A1c w/o eAG (Completed)   Screen for sexually transmitted diseases       Relevant Orders   Cytology - PAP (Completed)   Cervical cancer screening       Relevant Orders   Cytology - PAP (Completed)   Encounter for smoking cessation counseling       Relevant Medications   buPROPion (WELLBUTRIN SR) 150 MG 12 hr tablet   Dysmenorrhea       Relevant Medications   Elagolix Sodium (ORILISSA) 150 MG TABS   Menorrhagia with regular cycle       Relevant Medications   Elagolix Sodium (ORILISSA) 150 MG TABS   Blood tests for routine general physical examination       Relevant Orders   CBC with Differential/Platelet (Completed)   TSH (Completed)   Lipid Panel With LDL/HDL Ratio (Completed)   Hgb A1c w/o eAG (Completed)      1) STI screening  was offered and accepted  2)  ASCCP guidelines and rationale discussed.  Patient opts for every 3 years screening interval  3) Contraception - the patient is currently using  tubal ligation.  She is happy with her current form of contraception and plans to continue  4) Routine healthcare maintenance including cholesterol, diabetes screening discussed Ordered today   5) Return in about 1 year (around 10/04/2020) for annual established gyn.   Tresea Mall, CNM [facility] 10/08/2019, 10:50 AM

## 2019-10-30 ENCOUNTER — Other Ambulatory Visit: Payer: Self-pay | Admitting: Advanced Practice Midwife

## 2019-10-30 DIAGNOSIS — Z716 Tobacco abuse counseling: Secondary | ICD-10-CM

## 2019-11-05 ENCOUNTER — Emergency Department
Admission: EM | Admit: 2019-11-05 | Discharge: 2019-11-05 | Disposition: A | Discharge status: Home / Self Care | Payer: Medicaid Other | Attending: Emergency Medicine | Admitting: Emergency Medicine

## 2019-11-05 ENCOUNTER — Other Ambulatory Visit: Payer: Self-pay

## 2019-11-05 ENCOUNTER — Encounter: Payer: Self-pay | Admitting: Emergency Medicine

## 2019-11-05 DIAGNOSIS — F17210 Nicotine dependence, cigarettes, uncomplicated: Secondary | ICD-10-CM | POA: Insufficient documentation

## 2019-11-05 DIAGNOSIS — Z79899 Other long term (current) drug therapy: Secondary | ICD-10-CM | POA: Insufficient documentation

## 2019-11-05 DIAGNOSIS — J45909 Unspecified asthma, uncomplicated: Secondary | ICD-10-CM | POA: Insufficient documentation

## 2019-11-05 DIAGNOSIS — J069 Acute upper respiratory infection, unspecified: Secondary | ICD-10-CM | POA: Insufficient documentation

## 2019-11-05 DIAGNOSIS — R0981 Nasal congestion: Secondary | ICD-10-CM | POA: Diagnosis present

## 2019-11-05 MED ORDER — AZITHROMYCIN 250 MG PO TABS: ORAL_TABLET | ORAL | 0 refills | Status: DC

## 2019-11-05 NOTE — ED Notes (Signed)
Pt c/o congestion, cough, runny nose, sneezing, and head pressure x 3 days. Pt states hx of sinus problems.

## 2019-11-05 NOTE — ED Provider Notes (Signed)
[facility] Emergency Department Provider Note  ____________________________________________  Time seen: Approximately 2:56 PM  I have reviewed the triage vital signs and the nursing notes.   HISTORY  Chief Complaint Nasal Congestion    HPI [name] is a 29 y.o. female that presents to the emergency department for evaluation of nasal congestion, cough for 1 week.  Patient states that she is not short of breath but sometimes have to take deep breaths.  She did hear some "rattling "in her chest.  She smokes about a pack of cigarettes per day.  Her son has similar symptoms.  She is tested weakly for Covid at work and was tested this week and tested negative.  Her son was also tested for Covid this week while he has been symptomatic and tested negative.  Work will test her again at the beginning of next week.  No fevers, chest pain, vomiting, abdominal pain.   Past Medical History:  Diagnosis Date  . Anemia   . Anxiety   . Asthma   . Chlamydia   . Endometriosis   . Enlarged thyroid   . Headache   . UTI (urinary tract infection) during pregnancy     Patient Active Problem List   Diagnosis Date Noted  . Suicidal ideation 09/18/2015  . Panic disorder 09/18/2015  . Adjustment disorder with anxiety 09/18/2015  . Rectus sheath hematoma 02/28/2015  . S/P cesarean section 02/22/2015  . DISORDER, DEPRESSIVE NEC 05/04/2007  . History of other specified conditions presenting hazards to health 04/30/2007  . TOBACCO ABUSE 12/31/2006  . MIGRAINE-COMMON 10/09/2006  . PMS 10/02/2006    Past Surgical History:  Procedure Laterality Date  . CESAREAN SECTION  2010  . CESAREAN SECTION WITH BILATERAL TUBAL LIGATION Bilateral 02/22/2015   Procedure: CESAREAN SECTION WITH BILATERAL TUBAL LIGATION;  Surgeon: Catalina Antigua, MD;  Location: [facility];  Service: Obstetrics;  Laterality: Bilateral;  . DILATATION AND CURRETAGE    . DILATION AND EVACUATION  04/01/2011   Procedure: DILATATION AND EVACUATION (D&E);  Surgeon: Almon Hercules;  Location: [facility];  Service: Gynecology;  Laterality: N/A;  . TONSILLECTOMY  2009  . TYMPANOSTOMY TUBE PLACEMENT      Prior to Admission medications   Medication Sig Start Date End Date Taking? Authorizing Provider  acetaminophen (TYLENOL) 500 MG tablet Take 500 mg by mouth every 6 (six) hours as needed for mild pain or headache.     [provider]  azithromycin (ZITHROMAX Z-PAK) 250 MG tablet Take 2 tablets (500 mg) on  Day 1,  followed by 1 tablet (250 mg) once daily on Days 2 through 5. 11/05/19   Enid Derry, PA-C  buPROPion [facility] SR) 150 MG 12 hr tablet Take 1 tablet (150 mg total) by mouth daily. for 3 days then increase to 150 mg every 12 hours 10/05/19   Tresea Mall, CNM  Elagolix Sodium (ORILISSA) 150 MG TABS Take 150 mg by mouth daily. 10/05/19   Tresea Mall, CNM  ferrous sulfate (FERROUSUL) 325 (65 FE) MG tablet Take 1 tablet (325 mg total) by mouth 2 (two) times daily. Patient not taking: Reported on 02/28/2015 02/25/15   Katrinka Blazing, [place], CNM  OVER THE COUNTER MEDICATION Take 1 tablet by mouth 3 (three) times daily. Herbal vitamin for breast feeding    [provider]  polyethylene glycol (MIRALAX / GLYCOLAX) packet Take 17 g by mouth 2 (two) times daily as needed. Patient not taking: Reported on 10/05/2019 02/25/15   Katrinka Blazing [place],  CNM  prenatal vitamin w/FE, FA (PRENATAL 1 + 1) 27-1 MG TABS tablet Take 1 tablet by mouth daily at 12 noon.    [provider]  diphenhydrAMINE (BENADRYL) 25 MG tablet Take 1 tablet (25 mg total) by mouth every 6 (six) hours as needed for itching. 01/18/12 07/01/12  Junius Creamer, NP    Allergies Patient has no known allergies.  Family History  Problem Relation Age of Onset  . Depression Mother   . Hyperlipidemia Mother   . Diabetes Mother   . Heart failure Mother   . Anesthesia problems Neg Hx     Social History Social History   Tobacco  Use  . Smoking status: Current Every Day Smoker    Packs/day: 1.00    Years: 3.00    Pack years: 3.00    Types: Cigarettes  . Smokeless tobacco: Never Used  Substance Use Topics  . Alcohol use: Yes  . Drug use: Yes    Types: Marijuana     Review of Systems  Constitutional: No fever/chills Eyes: No visual changes. No discharge. ENT: Positive for congestion and rhinorrhea. Cardiovascular: No chest pain. Respiratory: Positive for cough. No SOB. Gastrointestinal: No abdominal pain.  No nausea, no vomiting.   Musculoskeletal: Negative for musculoskeletal pain. Skin: Negative for rash, abrasions, lacerations, ecchymosis. Neurological: Negative for headaches.   ____________________________________________   PHYSICAL EXAM:  VITAL SIGNS: ED Triage Vitals  Enc Vitals Group     BP 11/05/19 1407 119/65     Pulse Rate 11/05/19 1407 75     Resp 11/05/19 1407 14     Temp 11/05/19 1407 97.7 F (36.5 C)     Temp Source 11/05/19 1407 Oral     SpO2 11/05/19 1407 97 %     Weight 11/05/19 1400 145 lb (65.8 kg)     Height 11/05/19 1400 5\' 3"  (1.6 m)     Head Circumference --      Peak Flow --      Pain Score 11/05/19 1400 6     Pain Loc --      Pain Edu? --      Excl. in [place]? --      Constitutional: Alert and oriented. Well appearing and in no acute distress. Eyes: Conjunctivae are normal. PERRL. EOMI. No discharge. Head: Atraumatic. ENT: No frontal and maxillary sinus tenderness.      Ears: Tympanic membranes pearly gray with good landmarks. No discharge.      Nose: Mild congestion/rhinnorhea.      Mouth/Throat: Mucous membranes are moist. Oropharynx non-erythematous. Tonsils not enlarged. No exudates. Uvula midline. Neck: No stridor.   Hematological/Lymphatic/Immunilogical: No cervical lymphadenopathy. Cardiovascular: Normal rate, regular rhythm.  Good peripheral circulation. Respiratory: Normal respiratory effort without tachypnea or retractions. Lungs CTAB. Good air entry  to the bases with no decreased or absent breath sounds. Gastrointestinal: Bowel sounds 4 quadrants. Soft and nontender to palpation. No guarding or rigidity. No palpable masses. No distention. Musculoskeletal: Full range of motion to all extremities. No gross deformities appreciated. Neurologic:  Normal speech and language. No gross focal neurologic deficits are appreciated.  Skin:  Skin is warm, dry and intact. No rash noted. Psychiatric: Mood and affect are normal. Speech and behavior are normal. Patient exhibits appropriate insight and judgement.   ____________________________________________   LABS (all labs ordered are listed, but only abnormal results are displayed)  Labs Reviewed - No data to display ____________________________________________  EKG   ____________________________________________  RADIOLOGY  No results found.  ____________________________________________  PROCEDURES  Procedure(s) performed:    Procedures    Medications - No data to display   ____________________________________________   INITIAL IMPRESSION / ASSESSMENT AND PLAN / ED COURSE  Pertinent labs & imaging results that were available during my care of the patient were reviewed by me and considered in my medical decision making (see chart for details).  Review of the [place] CSRS was performed in accordance of the [facility] prior to dispensing any controlled drugs.   Patient's diagnosis is consistent with URI. Vital signs and exam are reassuring.  Patient declines chest x-ray.  She was tested this week for Covid and will be tested again early next week.  She declines Covid testing today.  Patient appears well and is staying well hydrated.  Patient feels comfortable going home. Patient will be discharged home with prescriptions for azithromycin. Patient is to follow up with pediatrician as needed or otherwise directed. Patient is given ED precautions to return to the ED for any worsening or  new symptoms.   [name] was evaluated in Emergency Department on 11/05/2019 for the symptoms described in the history of present illness. She was evaluated in the context of the global COVID-19 pandemic, which necessitated consideration that the patient might be at risk for infection with the SARS-CoV-2 virus that causes COVID-19. Institutional protocols and algorithms that pertain to the evaluation of patients at risk for COVID-19 are in a state of rapid change based on information released by regulatory bodies including the CDC and federal and state organizations. These policies and algorithms were followed during the patient's care in the ED.  ____________________________________________  FINAL CLINICAL IMPRESSION(S) / ED DIAGNOSES  Final diagnoses:  Upper respiratory tract infection, unspecified type      NEW MEDICATIONS STARTED DURING THIS VISIT:  ED Discharge Orders         Ordered    azithromycin (ZITHROMAX Z-PAK) 250 MG tablet     11/05/19 1456              This chart was dictated using voice recognition software/Dragon. Despite best efforts to proofread, errors can occur which can change the meaning. Any change was purely unintentional.    Enid Derry, PA-C 11/05/19 1605    Jene Every, MD 11/15/19 1439

## 2019-11-05 NOTE — ED Triage Notes (Signed)
Here for sinus drainage and nasal congestion.  Sx for 2-3 days. No fever. No covid exposure. Tested for work and covid negative.  Some facial pressure, reports feels like sinus infection.

## 2020-03-06 ENCOUNTER — Ambulatory Visit: Payer: Medicaid Other | Admitting: Obstetrics

## 2020-03-27 ENCOUNTER — Encounter: Payer: Self-pay | Admitting: Advanced Practice Midwife

## 2020-03-27 ENCOUNTER — Ambulatory Visit: Payer: Medicaid Other

## 2020-03-27 ENCOUNTER — Other Ambulatory Visit: Payer: Self-pay

## 2020-03-27 ENCOUNTER — Ambulatory Visit: Payer: Medicaid Other | Admitting: Advanced Practice Midwife

## 2020-03-27 DIAGNOSIS — T7411XS Adult physical abuse, confirmed, sequela: Secondary | ICD-10-CM

## 2020-03-27 DIAGNOSIS — T7411XA Adult physical abuse, confirmed, initial encounter: Secondary | ICD-10-CM | POA: Insufficient documentation

## 2020-03-27 DIAGNOSIS — Z113 Encounter for screening for infections with a predominantly sexual mode of transmission: Secondary | ICD-10-CM | POA: Diagnosis not present

## 2020-03-27 DIAGNOSIS — Z9851 Tubal ligation status: Secondary | ICD-10-CM

## 2020-03-27 LAB — WET PREP FOR TRICH, YEAST, CLUE
Trichomonas Exam: NEGATIVE
Yeast Exam: NEGATIVE

## 2020-03-27 NOTE — Progress Notes (Signed)
[facility] Department STI clinic/screening visit  Subjective:  [name] is a 29 y.o.SWF [name] female being seen today for an STI screening visit. The patient reports they do have symptoms.  Patient reports that they do not desire a pregnancy in the next year.   They reported they are not interested in discussing contraception today.  Patient's last menstrual period was 03/20/2020 (approximate).   Patient has the following medical conditions:   Patient Active Problem List   Diagnosis Date Noted  . Suicidal ideation 09/18/2015  . Panic disorder 09/18/2015  . Adjustment disorder with anxiety 09/18/2015  . Rectus sheath hematoma 02/28/2015  . S/P cesarean section 02/22/2015  . DISORDER, DEPRESSIVE NEC 05/04/2007  . History of other specified conditions presenting hazards to health 04/30/2007  . TOBACCO ABUSE 12/31/2006  . MIGRAINE-COMMON 10/09/2006  . PMS 10/02/2006    No chief complaint on file.   HPI  Patient reports malodor to urine x 1.5 weeks, pink-brown d/c x 1.5 wks, dry vagina, hot flashes.  LMP 03/10/20.  Last coitus 03/11/20 without condom; with current partner x 3 years; 1 sex partner in last 3 mo.  Smoking 1-1.5 ppd cigs.  Last MJ 02/2020.  Last ETOH 03/26/20 (shots whiskey, 3 glasses wine)q weekend.  Douched 2 wks ago with 1/2 water and 1/2 apple cider vinegar and also uses Boric acid suppositories prn.  BTL 2016.  Last HIV test per patient/review of record was 08/26/18 Patient reports last pap was 3/2/1` neg   See flowsheet for further details and programmatic requirements.    The following portions of the patient's history were reviewed and updated as appropriate: allergies, current medications, past medical history, past social history, past surgical history and problem list.  Objective:  There were no vitals filed for this visit.  Physical Exam Vitals and nursing note reviewed.  Constitutional:      Appearance: Normal appearance.  HENT:      Head: Normocephalic and atraumatic.     Mouth/Throat:     Mouth: Mucous membranes are moist.     Pharynx: Oropharynx is clear. No oropharyngeal exudate or posterior oropharyngeal erythema.  Eyes:     Conjunctiva/sclera: Conjunctivae normal.  Pulmonary:     Effort: Pulmonary effort is normal.  Abdominal:     Palpations: Abdomen is soft. There is no mass.     Tenderness: There is no abdominal tenderness. There is no rebound.     Comments: Soft without tenderness, fair tone  Genitourinary:    General: Normal vulva.     Exam position: Lithotomy position.     Pubic Area: No rash or pubic lice.      Labia:        Right: No rash or lesion.        Left: No rash or lesion.      Vagina: Vaginal discharge (white creamy, ph<4.5) present. No erythema, bleeding or lesions.     Cervix: Normal.     Uterus: Normal.      Adnexa: Right adnexa normal and left adnexa normal.     Rectum: Normal.  Lymphadenopathy:     Head:     Right side of head: No preauricular or posterior auricular adenopathy.     Left side of head: No preauricular or posterior auricular adenopathy.     Cervical: No cervical adenopathy.     Upper Body:     Right upper body: No supraclavicular or axillary adenopathy.     Left upper body: No supraclavicular  or axillary adenopathy.     Lower Body: No right inguinal adenopathy. No left inguinal adenopathy.  Skin:    General: Skin is warm and dry.     Findings: No rash.  Neurological:     Mental Status: She is alert and oriented to person, place, and time.      Assessment and Plan:  [name] is a 29 y.o. female presenting to the [facility] Department for STI screening  1. Screening examination for venereal disease Treat wet mount per standing orders Immunization nurse consult Counseled via 5 A's to stop smoking - HIV [facility] - Chlamydia/Gonorrhea [facility] - Syphilis Serology, [place] [facility] - Gonococcus culture - Gonococcus culture - WET PREP  FOR TRICH, YEAST, CLUE     No follow-ups on file.  No future appointments.  Alberteen Spindle, CNM

## 2020-03-27 NOTE — Progress Notes (Signed)
In for STD screen, reports vaginal symptoms.  Results reviewed with provider, per standing order, no treatment indicated. Sharlyne Pacas, RN

## 2020-04-01 LAB — GONOCOCCUS CULTURE

## 2020-07-19 ENCOUNTER — Emergency Department: Payer: Medicaid Other

## 2020-07-19 ENCOUNTER — Emergency Department
Admission: EM | Admit: 2020-07-19 | Discharge: 2020-07-19 | Disposition: A | Discharge status: Home / Self Care | Payer: Medicaid Other | Attending: Emergency Medicine | Admitting: Emergency Medicine

## 2020-07-19 ENCOUNTER — Encounter: Payer: Self-pay | Admitting: Emergency Medicine

## 2020-07-19 ENCOUNTER — Other Ambulatory Visit: Payer: Self-pay

## 2020-07-19 DIAGNOSIS — M5442 Lumbago with sciatica, left side: Secondary | ICD-10-CM | POA: Insufficient documentation

## 2020-07-19 DIAGNOSIS — J45909 Unspecified asthma, uncomplicated: Secondary | ICD-10-CM | POA: Insufficient documentation

## 2020-07-19 DIAGNOSIS — F17210 Nicotine dependence, cigarettes, uncomplicated: Secondary | ICD-10-CM | POA: Insufficient documentation

## 2020-07-19 DIAGNOSIS — Z79899 Other long term (current) drug therapy: Secondary | ICD-10-CM | POA: Diagnosis not present

## 2020-07-19 DIAGNOSIS — M545 Low back pain, unspecified: Secondary | ICD-10-CM | POA: Diagnosis present

## 2020-07-19 MED ORDER — PREDNISONE 10 MG PO TABS: ORAL_TABLET | ORAL | 0 refills | Status: AC

## 2020-07-19 MED ORDER — METHOCARBAMOL 750 MG PO TABS: 750.0000 mg | ORAL_TABLET | Freq: Four times a day (QID) | ORAL | 0 refills | Status: AC | PRN

## 2020-07-19 MED ORDER — ACETAMINOPHEN 325 MG PO TABS
650.0000 mg | ORAL_TABLET | Freq: Once | ORAL | Status: AC
  Administered 2020-07-19: 13:00:00 650 mg via ORAL
  Filled 2020-07-19: qty 2

## 2020-07-19 MED ORDER — METHOCARBAMOL 500 MG PO TABS
750.0000 mg | ORAL_TABLET | Freq: Once | ORAL | Status: AC
  Administered 2020-07-19: 13:00:00 750 mg via ORAL
  Filled 2020-07-19: qty 2

## 2020-07-19 MED ORDER — KETOROLAC TROMETHAMINE 60 MG/2ML IM SOLN
30.0000 mg | Freq: Once | INTRAMUSCULAR | Status: AC
  Administered 2020-07-19: 13:00:00 30 mg via INTRAMUSCULAR
  Filled 2020-07-19: qty 2

## 2020-07-19 NOTE — ED Notes (Signed)
See triage note. Pt ambulatory to room; steady. Pt denies any major symptoms besides back pain radiating down L leg.

## 2020-07-19 NOTE — ED Triage Notes (Addendum)
C/O lower left back pain radiating down left leg.

## 2020-07-19 NOTE — ED Notes (Signed)
Pt transported to xray 
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
## 2020-12-18 ENCOUNTER — Ambulatory Visit: Payer: Medicaid Other

## 2020-12-20 ENCOUNTER — Ambulatory Visit: Payer: Medicaid Other | Admitting: Physician Assistant

## 2020-12-20 ENCOUNTER — Other Ambulatory Visit: Payer: Self-pay

## 2020-12-20 DIAGNOSIS — Z113 Encounter for screening for infections with a predominantly sexual mode of transmission: Secondary | ICD-10-CM | POA: Diagnosis not present

## 2020-12-20 DIAGNOSIS — N760 Acute vaginitis: Secondary | ICD-10-CM

## 2020-12-20 DIAGNOSIS — B9689 Other specified bacterial agents as the cause of diseases classified elsewhere: Secondary | ICD-10-CM

## 2020-12-20 LAB — WET PREP FOR TRICH, YEAST, CLUE
Trichomonas Exam: NEGATIVE
Yeast Exam: NEGATIVE

## 2020-12-20 MED ORDER — METRONIDAZOLE 500 MG PO TABS: 500.0000 mg | ORAL_TABLET | Freq: Two times a day (BID) | ORAL | 0 refills | Status: AC

## 2020-12-20 NOTE — Progress Notes (Signed)
Pt here for STD screening.  Wet mount results reviewed with Provider.  Medication dispensed per Providers orders. Berdie Ogren, RN

## 2020-12-21 ENCOUNTER — Encounter: Payer: Self-pay | Admitting: Physician Assistant
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
## 2021-01-16 ENCOUNTER — Telehealth: Payer: Self-pay

## 2021-01-16 NOTE — Telephone Encounter (Signed)
Created in error

## 2021-02-09 ENCOUNTER — Ambulatory Visit: Payer: Medicaid Other

## 2021-02-28 ENCOUNTER — Ambulatory Visit: Payer: Medicaid Other | Admitting: Family Medicine

## 2021-02-28 ENCOUNTER — Other Ambulatory Visit: Payer: Self-pay

## 2021-02-28 DIAGNOSIS — Z113 Encounter for screening for infections with a predominantly sexual mode of transmission: Secondary | ICD-10-CM | POA: Diagnosis not present

## 2021-02-28 LAB — WET PREP FOR TRICH, YEAST, CLUE
Trichomonas Exam: NEGATIVE
Yeast Exam: NEGATIVE
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
## 2021-03-22 IMAGING — CR DG LUMBAR SPINE 2-3V
1 series · 3 of 3 positions shown · non-contrast
Comparison: CT abdomen pelvis 02/28/2015.

CLINICAL DATA: Low back pain. Left back pain radiating down to left
leg. History of pinched nerve in lower back.

EXAM:
LUMBAR SPINE - 2-3 VIEW

[Series 1: dg lumbar spine 2-3 views · 0.14mm/px · 3 of 3 slices shown]
[im 1/3]
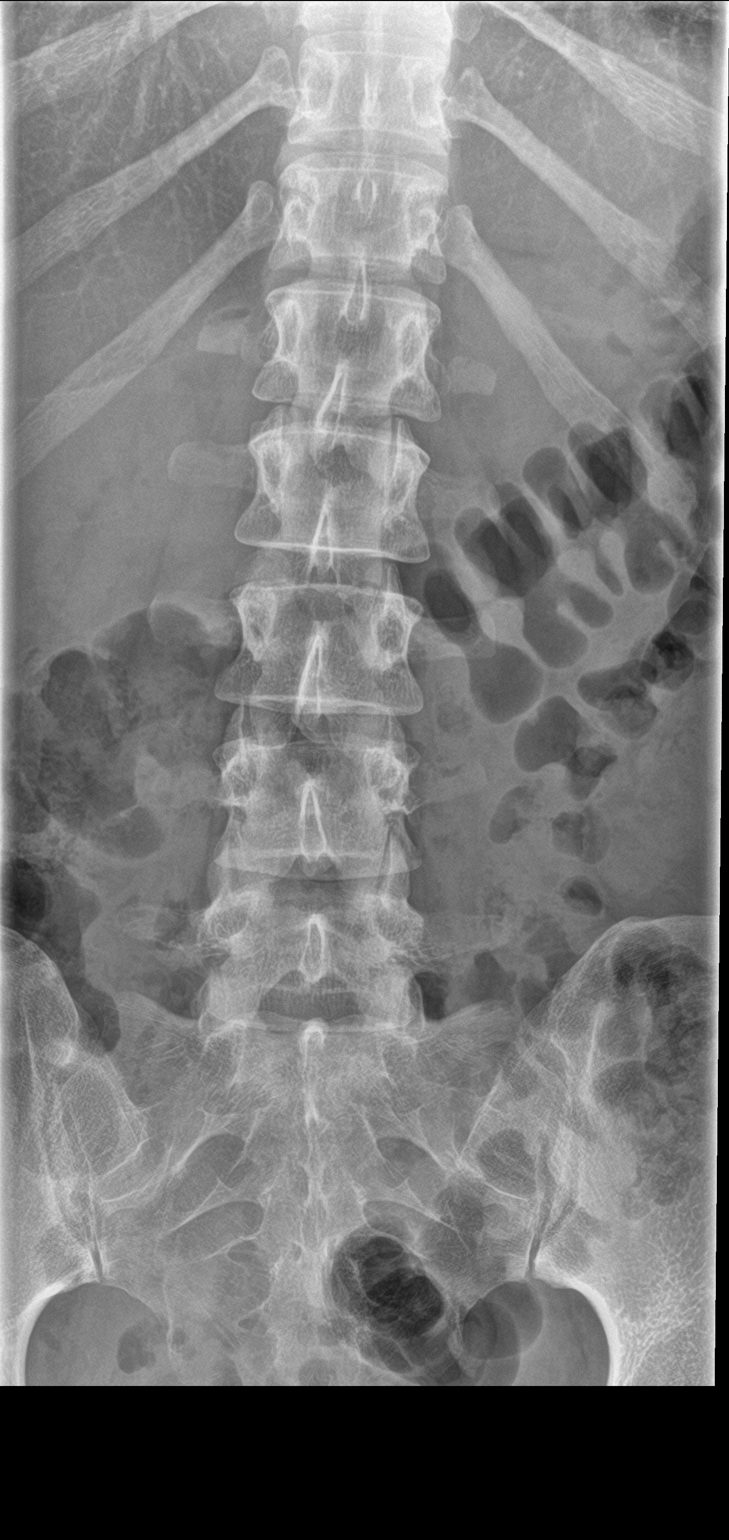
[im 2/3]
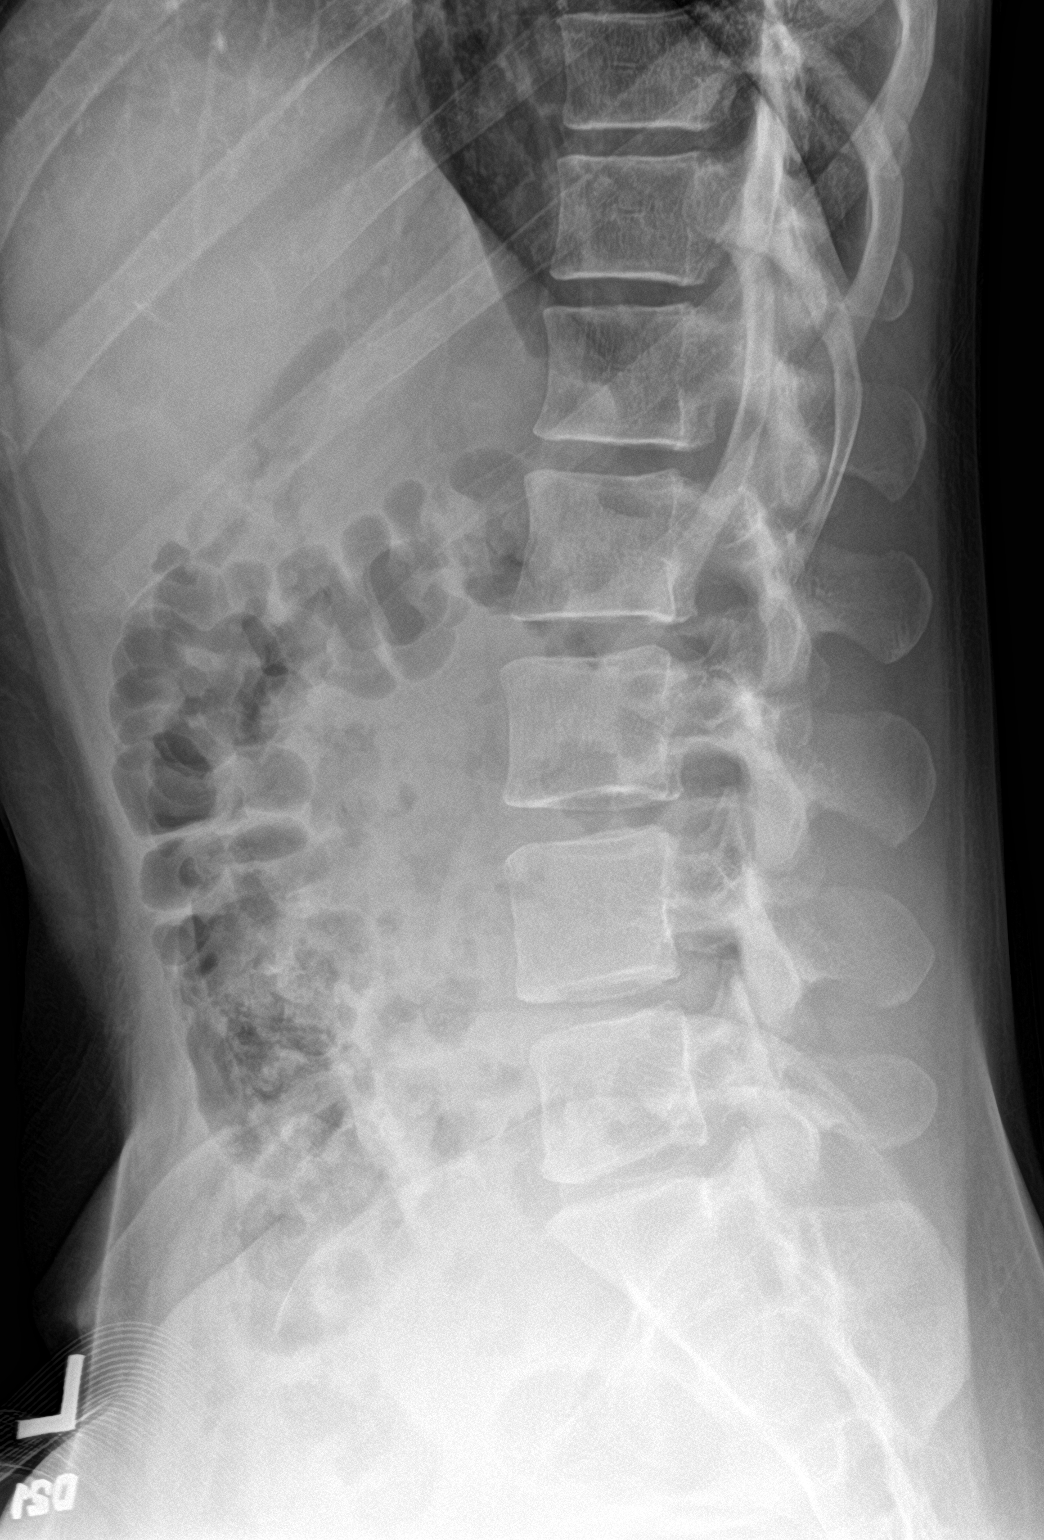
[im 3/3]
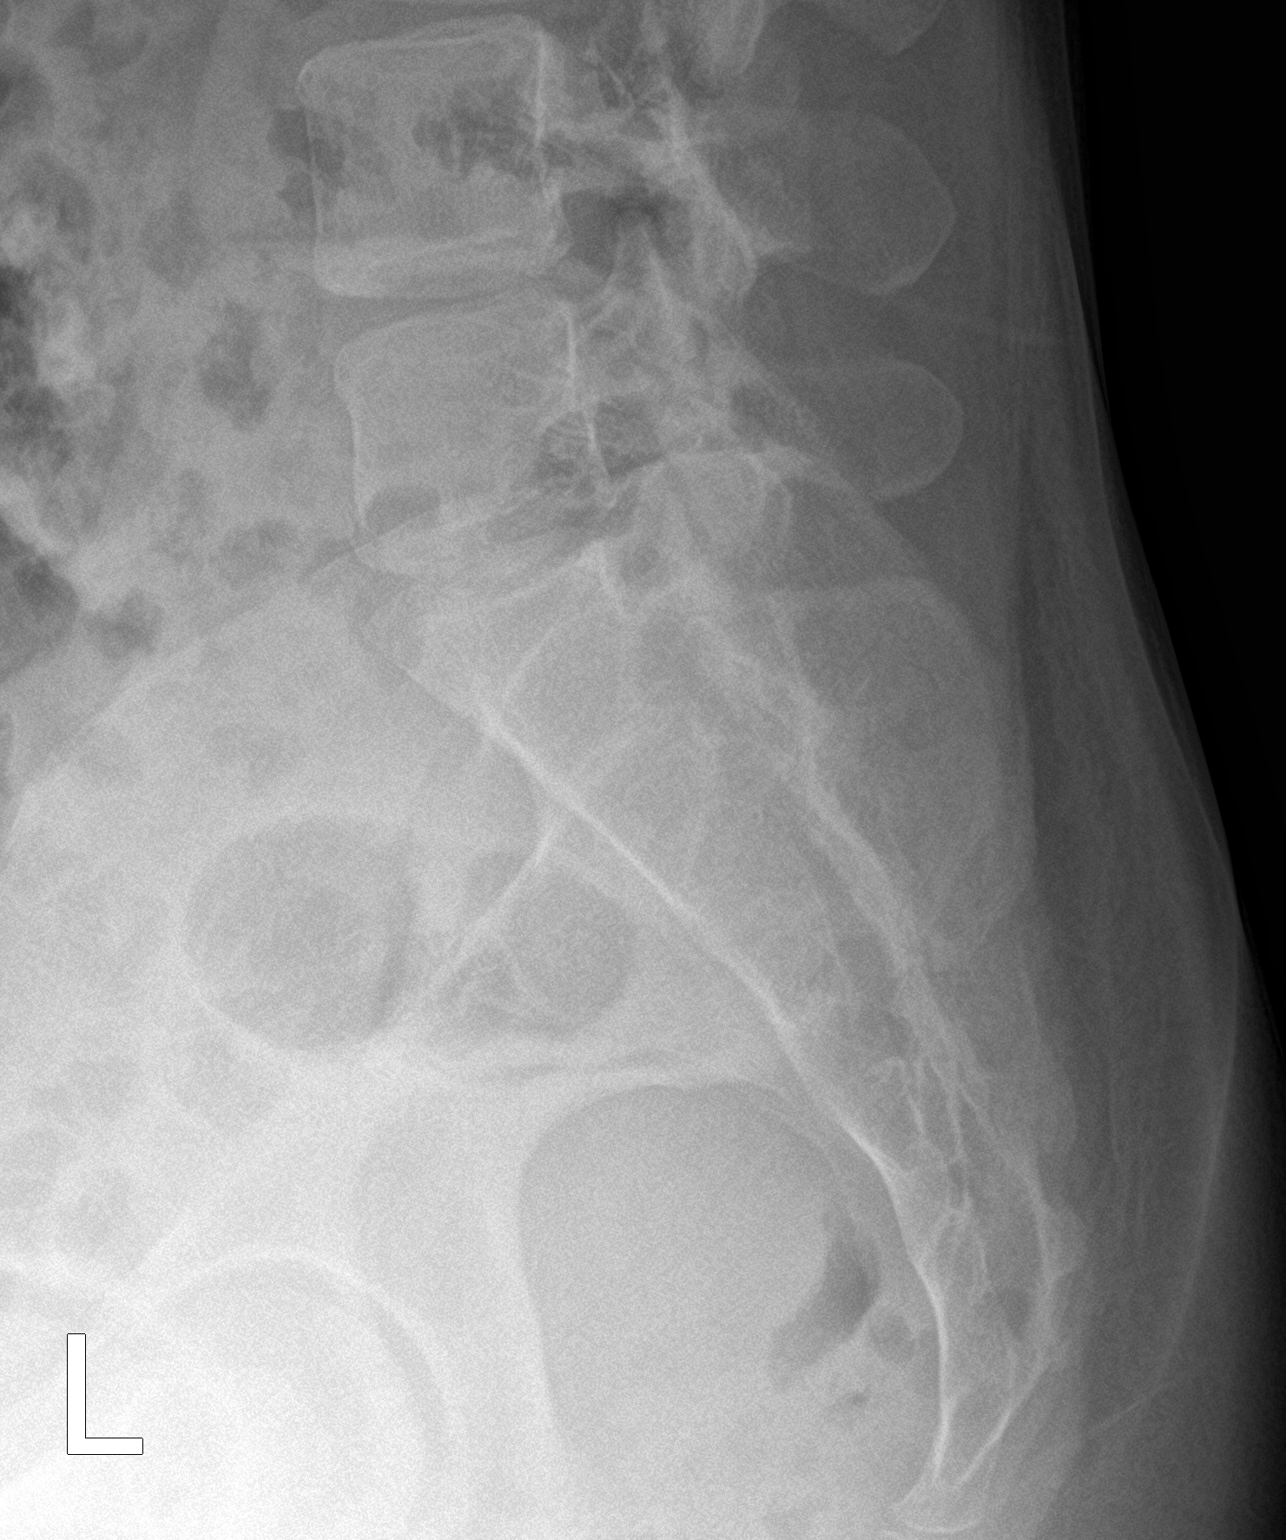

[3 of 3 positions shown; findings below may reference images not displayed]

FINDINGS: Five non-rib-bearing lumbar vertebral bodies. There is no evidence
of lumbar spine fracture. Alignment is normal. Intervertebral disc
spaces are maintained. No severe degenerative changes. No severe
osseous neural foraminal stenosis or severe osseous central canal
stenosis.
IMPRESSION: No acute displaced fracture or traumatic listhesis of the lumbar
spine.

## 2021-04-03 ENCOUNTER — Other Ambulatory Visit: Payer: Self-pay

## 2021-04-03 ENCOUNTER — Ambulatory Visit: Payer: Medicaid Other | Admitting: Physician Assistant

## 2021-04-03 DIAGNOSIS — Z113 Encounter for screening for infections with a predominantly sexual mode of transmission: Secondary | ICD-10-CM | POA: Diagnosis not present

## 2021-04-03 DIAGNOSIS — A5901 Trichomonal vulvovaginitis: Secondary | ICD-10-CM

## 2021-04-03 LAB — WET PREP FOR TRICH, YEAST, CLUE
Trichomonas Exam: POSITIVE — AB
Yeast Exam: NEGATIVE

## 2021-04-03 MED ORDER — METRONIDAZOLE 500 MG PO TABS: 500.0000 mg | ORAL_TABLET | Freq: Two times a day (BID) | ORAL | 0 refills | Status: AC

## 2021-04-04 ENCOUNTER — Encounter: Payer: Self-pay | Admitting: Physician Assistant
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
## 2021-04-08 NOTE — Progress Notes (Signed)
Chart reviewed by Pharmacist  Suzanne Walker PharmD, Contract Pharmacist at [facility]  

## 2021-04-16 ENCOUNTER — Ambulatory Visit: Payer: Medicaid Other

## 2021-07-17 ENCOUNTER — Encounter: Payer: Self-pay | Admitting: Family Medicine

## 2021-07-17 ENCOUNTER — Other Ambulatory Visit: Payer: Self-pay

## 2021-07-17 ENCOUNTER — Ambulatory Visit: Payer: Medicaid Other | Admitting: Family Medicine

## 2021-07-17 DIAGNOSIS — Z113 Encounter for screening for infections with a predominantly sexual mode of transmission: Secondary | ICD-10-CM

## 2021-07-17 DIAGNOSIS — Z299 Encounter for prophylactic measures, unspecified: Secondary | ICD-10-CM

## 2021-07-17 LAB — WET PREP FOR TRICH, YEAST, CLUE
Trichomonas Exam: NEGATIVE
Yeast Exam: NEGATIVE

## 2021-07-17 MED ORDER — DOXYCYCLINE HYCLATE 100 MG PO TABS: 100.0000 mg | ORAL_TABLET | Freq: Two times a day (BID) | ORAL | 0 refills | Status: AC

## 2021-07-17 NOTE — Progress Notes (Signed)
Pt stated she needs to leave in time to pick up child by 230.
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
## 2021-07-17 NOTE — Progress Notes (Signed)
Pt here for STD screening.  Wet mount results reviewed.  Medication dispensed per Provider orders. Pt declined condoms. Corday Wyka M Joquan Lotz, RN  

## 2021-08-10 ENCOUNTER — Other Ambulatory Visit: Payer: Self-pay

## 2021-08-10 ENCOUNTER — Ambulatory Visit: Payer: Medicaid Other | Admitting: Licensed Practical Nurse

## 2021-08-13 ENCOUNTER — Ambulatory Visit: Payer: Medicaid Other | Admitting: Family Medicine

## 2021-08-13 ENCOUNTER — Other Ambulatory Visit: Payer: Self-pay

## 2021-08-13 DIAGNOSIS — A599 Trichomoniasis, unspecified: Secondary | ICD-10-CM

## 2021-08-13 DIAGNOSIS — Z113 Encounter for screening for infections with a predominantly sexual mode of transmission: Secondary | ICD-10-CM

## 2021-08-13 LAB — WET PREP FOR TRICH, YEAST, CLUE
Trichomonas Exam: POSITIVE — AB
Yeast Exam: NEGATIVE

## 2021-08-13 MED ORDER — METRONIDAZOLE 500 MG PO TABS: 500.0000 mg | ORAL_TABLET | Freq: Two times a day (BID) | ORAL | 0 refills | Status: AC

## 2021-08-13 NOTE — Progress Notes (Signed)
Patient seen today for STD testing. Wet mount reviewed by provider. Orders completed. Condoms declined.
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
## 2021-09-11 ENCOUNTER — Other Ambulatory Visit: Payer: Self-pay

## 2021-09-11 ENCOUNTER — Ambulatory Visit: Payer: Medicaid Other | Admitting: Family Medicine

## 2021-09-11 ENCOUNTER — Encounter: Payer: Self-pay | Admitting: Family Medicine

## 2021-09-11 DIAGNOSIS — Z113 Encounter for screening for infections with a predominantly sexual mode of transmission: Secondary | ICD-10-CM | POA: Diagnosis not present

## 2021-09-11 LAB — WET PREP FOR TRICH, YEAST, CLUE
Trichomonas Exam: NEGATIVE
Yeast Exam: NEGATIVE

## 2021-09-11 NOTE — Progress Notes (Signed)
Pt here for STD screening.  Wet mount results reviewed, no treatment required per SO.  Pt declined condoms.  Batsheva Stevick M Cybil Senegal, RN ° °
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
## 2022-04-16 ENCOUNTER — Encounter: Payer: Self-pay | Admitting: Advanced Practice Midwife

## 2022-04-16 ENCOUNTER — Ambulatory Visit: Payer: Medicaid Other | Admitting: Advanced Practice Midwife

## 2022-04-16 DIAGNOSIS — Z62810 Personal history of physical and sexual abuse in childhood: Secondary | ICD-10-CM | POA: Insufficient documentation

## 2022-04-16 DIAGNOSIS — Z113 Encounter for screening for infections with a predominantly sexual mode of transmission: Secondary | ICD-10-CM

## 2022-04-16 DIAGNOSIS — B9689 Other specified bacterial agents as the cause of diseases classified elsewhere: Secondary | ICD-10-CM

## 2022-04-16 LAB — WET PREP FOR TRICH, YEAST, CLUE
Trichomonas Exam: NEGATIVE
Trichomonas Exam: NEGATIVE
Yeast Exam: NEGATIVE
Yeast Exam: NEGATIVE

## 2022-04-16 MED ORDER — METRONIDAZOLE 500 MG PO TABS: 500.0000 mg | ORAL_TABLET | Freq: Two times a day (BID) | ORAL | 0 refills | Status: AC

## 2022-04-16 NOTE — Progress Notes (Signed)
Provider re-collected wet mount = treatment given per SO to treat for BV. Education given on ABX and questions answered.   Patient aware to wait 2-3 weeks for gc/chlamydia results.   PCP list given to patient per request.   Earlyne Iba, RN
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
## 2022-04-16 NOTE — Progress Notes (Signed)
Patient here for STD visit. She thinks she might have BV. Declines chlamydia/gonorrhea and declines blood work.   GC/Chlamydia = negative (08/13/21)  Patient stated she would like to self-collect wet mount.   Earlyne Iba, RN

## 2022-04-22 ENCOUNTER — Encounter: Payer: Self-pay | Admitting: Advanced Practice Midwife

## 2022-04-23 ENCOUNTER — Ambulatory Visit: Payer: Medicaid Other

## 2022-06-20 ENCOUNTER — Ambulatory Visit (HOSPITAL_COMMUNITY): Payer: Medicaid Other

## 2022-09-30 ENCOUNTER — Ambulatory Visit: Payer: Medicaid Other | Admitting: Family Medicine

## 2022-10-18 ENCOUNTER — Ambulatory Visit: Payer: Medicaid Other | Admitting: Family

## 2022-10-18 ENCOUNTER — Encounter: Payer: Self-pay | Admitting: Family

## 2022-10-18 DIAGNOSIS — N760 Acute vaginitis: Secondary | ICD-10-CM

## 2022-10-18 DIAGNOSIS — Z113 Encounter for screening for infections with a predominantly sexual mode of transmission: Secondary | ICD-10-CM

## 2022-10-18 DIAGNOSIS — B9689 Other specified bacterial agents as the cause of diseases classified elsewhere: Secondary | ICD-10-CM

## 2022-10-18 LAB — WET PREP FOR TRICH, YEAST, CLUE
Trichomonas Exam: NEGATIVE
Yeast Exam: NEGATIVE
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
## 2022-11-18 ENCOUNTER — Ambulatory Visit: Payer: Medicaid Other | Admitting: Advanced Practice Midwife

## 2022-11-18 DIAGNOSIS — A599 Trichomoniasis, unspecified: Secondary | ICD-10-CM

## 2022-11-18 DIAGNOSIS — Z113 Encounter for screening for infections with a predominantly sexual mode of transmission: Secondary | ICD-10-CM

## 2022-11-18 LAB — WET PREP FOR TRICH, YEAST, CLUE
Trichomonas Exam: POSITIVE — AB
Yeast Exam: NEGATIVE

## 2022-11-18 LAB — HM HIV SCREENING LAB: HM HIV Screening: NEGATIVE

## 2022-11-18 LAB — HM HEPATITIS C SCREENING LAB: HM Hepatitis Screen: NEGATIVE

## 2022-11-18 MED ORDER — METRONIDAZOLE 500 MG PO TABS
500.0000 mg | ORAL_TABLET | Freq: Two times a day (BID) | ORAL | 0 refills | Status: AC
Start: 2022-11-18 — End: 2022-11-25
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
## 2022-11-18 NOTE — Progress Notes (Signed)
Pt is here for STD testing.  Wet mount results reviewed.  The patient was dispensed Metronidazole 500 mg #14 today. I provided counseling today regarding the medication. We discussed the medication, the side effects and when to call clinic. Patient given the opportunity to ask questions. Questions answered.  Condoms declined.   Berdie Ogren, RN

## 2022-11-18 NOTE — Addendum Note (Signed)
Addended by: Berdie Ogren on: 11/18/2022 04:03 PM   Modules accepted: Orders

## 2022-12-03 ENCOUNTER — Ambulatory Visit: Payer: Medicaid Other

## 2023-05-21 ENCOUNTER — Encounter: Payer: Self-pay | Admitting: Advanced Practice Midwife

## 2023-05-21 ENCOUNTER — Ambulatory Visit: Payer: Medicaid Other | Admitting: Advanced Practice Midwife

## 2023-05-21 DIAGNOSIS — Z113 Encounter for screening for infections with a predominantly sexual mode of transmission: Secondary | ICD-10-CM

## 2023-05-21 LAB — HM HIV SCREENING LAB: HM HIV Screening: NEGATIVE

## 2023-05-21 LAB — HM HEPATITIS C SCREENING LAB: HM Hepatitis Screen: NEGATIVE

## 2023-05-21 LAB — WET PREP FOR TRICH, YEAST, CLUE
Trichomonas Exam: NEGATIVE
Yeast Exam: NEGATIVE

## 2023-05-21 NOTE — Progress Notes (Signed)
PT is here for std screening, wet prep results reviewed no treatment required per standing order. Condoms declined. Gaspar Garbe, RN
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
## 2023-06-25 ENCOUNTER — Ambulatory Visit: Payer: Medicaid Other

## 2023-06-30 ENCOUNTER — Ambulatory Visit: Payer: Medicaid Other | Admitting: Family Medicine

## 2023-06-30 DIAGNOSIS — Z113 Encounter for screening for infections with a predominantly sexual mode of transmission: Secondary | ICD-10-CM

## 2023-06-30 LAB — WET PREP FOR TRICH, YEAST, CLUE
Trichomonas Exam: NEGATIVE
Yeast Exam: NEGATIVE
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
## 2023-06-30 NOTE — Progress Notes (Signed)
Pt is here for STD visit.  Wet prep results reviewed with pt, no treatment required per standing order.  Condoms declined.  Gaspar Garbe, RN

## 2024-06-03 ENCOUNTER — Emergency Department (HOSPITAL_COMMUNITY)
Admission: EM | Admit: 2024-06-03 | Discharge: 2024-06-03 | Disposition: A | Discharge status: Home / Self Care | Attending: Student in an Organized Health Care Education/Training Program | Admitting: Student in an Organized Health Care Education/Training Program

## 2024-06-03 ENCOUNTER — Emergency Department (HOSPITAL_COMMUNITY)

## 2024-06-03 ENCOUNTER — Encounter (HOSPITAL_COMMUNITY): Payer: Self-pay

## 2024-06-03 ENCOUNTER — Other Ambulatory Visit: Payer: Self-pay

## 2024-06-03 DIAGNOSIS — R110 Nausea: Secondary | ICD-10-CM | POA: Diagnosis not present

## 2024-06-03 DIAGNOSIS — R1030 Lower abdominal pain, unspecified: Secondary | ICD-10-CM | POA: Diagnosis not present

## 2024-06-03 DIAGNOSIS — R310 Gross hematuria: Secondary | ICD-10-CM | POA: Diagnosis not present

## 2024-06-03 DIAGNOSIS — R319 Hematuria, unspecified: Secondary | ICD-10-CM | POA: Diagnosis present

## 2024-06-03 DIAGNOSIS — R509 Fever, unspecified: Secondary | ICD-10-CM | POA: Insufficient documentation

## 2024-06-03 LAB — BASIC METABOLIC PANEL WITH GFR
Anion gap: 11 (ref 5–15)
BUN: 8 mg/dL (ref 6–20)
CO2: 22 mmol/L (ref 22–32)
Calcium: 8.9 mg/dL (ref 8.9–10.3)
Chloride: 104 mmol/L (ref 98–111)
Creatinine, Ser: 0.8 mg/dL (ref 0.44–1.00)
GFR, Estimated: >60 mL/min (ref >60)
Glucose, Bld: 94 mg/dL (ref 70–99)
Potassium: 4.3 mmol/L (ref 3.5–5.1)
Sodium: 137 mmol/L (ref 135–145)

## 2024-06-03 LAB — URINALYSIS, W/ REFLEX TO CULTURE (INFECTION SUSPECTED)
Bilirubin Urine: NEGATIVE
Glucose, UA: NEGATIVE mg/dL
Ketones, ur: NEGATIVE mg/dL
Leukocytes,Ua: NEGATIVE
Nitrite: NEGATIVE
Protein, ur: NEGATIVE mg/dL
Specific Gravity, Urine: 1.016 (ref 1.005–1.030)
pH: 6 (ref 5.0–8.0)

## 2024-06-03 LAB — CBC
HCT: 46.5 % — ABNORMAL HIGH (ref 36.0–46.0)
Hemoglobin: 15.5 g/dL — ABNORMAL HIGH (ref 12.0–15.0)
MCH: 32.6 pg (ref 26.0–34.0)
MCHC: 33.3 g/dL (ref 30.0–36.0)
MCV: 97.7 fL (ref 80.0–100.0)
Platelets: 276 K/uL (ref 150–400)
RBC: 4.76 MIL/uL (ref 3.87–5.11)
RDW: 11.9 % (ref 11.5–15.5)
WBC: 10.8 K/uL — ABNORMAL HIGH (ref 4.0–10.5)
nRBC: 0 % (ref 0.0–0.2)

## 2024-06-03 LAB — HCG, SERUM, QUALITATIVE: Preg, Serum: NEGATIVE

## 2024-06-03 MED ORDER — CYCLOBENZAPRINE HCL 10 MG PO TABS: 10.0000 mg | ORAL_TABLET | Freq: Two times a day (BID) | ORAL | 0 refills | Status: AC | PRN

## 2024-06-03 MED ORDER — ACETAMINOPHEN 500 MG PO TABS: 1000.0000 mg | ORAL_TABLET | Freq: Four times a day (QID) | ORAL | 0 refills | Status: AC | PRN

## 2024-06-03 NOTE — ED Triage Notes (Signed)
 QUICK TRIAGE: Pt ambulatory to triage with c/ fever and hematuria since last Friday.

## 2024-06-03 NOTE — ED Provider Notes (Signed)
 [place] EMERGENCY DEPARTMENT AT [facility] Provider Note   CSN: [id number] Arrival date & time: 06/03/24  1400     Patient presents with: Fever and Hematuria   Mackie LINH HEDBERG is a 33 y.o. female.   The history is provided by the patient, a parent and medical records. No language interpreter was used.  Fever Hematuria     33 year old female with history of STI, UTI, anxiety, panic disorder, presenting with complaints of blood in her urine.  Patient states for the past week she has had pain primarily to her left flank and left side of her abdomen.  Furthermore she also noticed blood in her urine.  She states that the symptoms felt similar to kidney stone that she had in the past.  She even went to urgent care center several days ago for her complaint and states that they felt her symptom is related to her kidney stone.  She was prescribed medication for that.  Today she endorsed feeling nauseous as well as well as having abdominal pain prompting this ER visit.  Increasing pain with movement, report subjective fever.  She does not endorse any chest pain or shortness of breath no productive cough no diarrhea constipation.  Prior to Admission medications   Medication Sig Start Date End Date Taking? Authorizing Provider  acetaminophen  (TYLENOL ) 500 MG tablet Take 500 mg by mouth every 6 (six) hours as needed for mild pain or headache. Patient not taking: Reported on 05/21/2023    [provider]  acidophilus (RISAQUAD) CAPS capsule Take 1 capsule by mouth daily. Patient not taking: Reported on 11/18/2022    [provider]  azithromycin  (ZITHROMAX  Z-PAK) 250 MG tablet Take 2 tablets (500 mg) on  Day 1,  followed by 1 tablet (250 mg) once daily on Days 2 through 5. Patient not taking: Reported on 04/16/2022 11/05/19   Alona Knee, PA-C  buPROPion  (WELLBUTRIN  SR) 150 MG 12 hr tablet Take 1 tablet (150 mg total) by mouth daily. for 3 days then increase to 150 mg  every 12 hours Patient not taking: Reported on 04/16/2022 10/05/19   Lynda Bradley, CNM  Elagolix Sodium  (ORILISSA ) 150 MG TABS Take 150 mg by mouth daily. Patient not taking: Reported on 04/16/2022 10/05/19   Lynda Bradley, CNM  ferrous sulfate  (FERROUSUL) 325 (65 FE) MG tablet Take 1 tablet (325 mg total) by mouth 2 (two) times daily. Patient not taking: Reported on 04/16/2022 02/25/15   Claudene, [place] , CNM  OVER THE COUNTER MEDICATION Take 1 tablet by mouth 3 (three) times daily. Herbal vitamin for breast feeding Patient not taking: Reported on 04/16/2022    [provider]  polyethylene glycol (MIRALAX  / GLYCOLAX ) packet Take 17 g by mouth 2 (two) times daily as needed. Patient not taking: Reported on 04/16/2022 02/25/15   Claudene, [place] , CNM  prenatal vitamin w/FE, FA (PRENATAL 1 + 1) 27-1 MG TABS tablet Take 1 tablet by mouth daily at 12 noon. Patient not taking: Reported on 04/16/2022    [provider]  diphenhydrAMINE  (BENADRYL ) 25 MG tablet Take 1 tablet (25 mg total) by mouth every 6 (six) hours as needed for itching. 01/18/12 07/01/12  Terryl Kubas, NP    Allergies: Patient has no known allergies.    Review of Systems  Constitutional:  Positive for fever.  Genitourinary:  Positive for hematuria.  All other systems reviewed and are negative.   Updated Vital Signs BP 114/70 (BP Location: Right Arm)   Pulse 82  Temp 98.6 F (37 C) (Oral)   Resp 16   SpO2 97%   Physical Exam Vitals and nursing note reviewed.  Constitutional:      General: She is not in acute distress.    Appearance: She is well-developed.  HENT:     Head: Atraumatic.  Eyes:     Conjunctiva/sclera: Conjunctivae normal.  Pulmonary:     Effort: Pulmonary effort is normal.  Abdominal:     Palpations: Abdomen is soft.     Tenderness: There is abdominal tenderness (Mild lower abdominal tenderness on palpation no guarding no rebound tenderness.). There is right CVA tenderness and left CVA  tenderness.  Musculoskeletal:     Cervical back: Neck supple.  Skin:    Findings: No rash.  Neurological:     Mental Status: She is alert.  Psychiatric:        Mood and Affect: Mood normal.     (all labs ordered are listed, but only abnormal results are displayed) Labs Reviewed  CBC - Abnormal; Notable for the following components:      Result Value   WBC 10.8 (*)    Hemoglobin 15.5 (*)    HCT 46.5 (*)    All other components within normal limits  URINALYSIS, W/ REFLEX TO CULTURE (INFECTION SUSPECTED) - Abnormal; Notable for the following components:   APPearance HAZY (*)    Hgb urine dipstick LARGE (*)    Bacteria, UA FEW (*)    All other components within normal limits  HCG, SERUM, QUALITATIVE  BASIC METABOLIC PANEL WITH GFR    EKG: None  Radiology: CT Renal Stone Study Result Date: 06/03/2024 CLINICAL DATA:  Flank pain with fever and hematuria 5-6 days. Possible renal stone. EXAM: CT ABDOMEN AND PELVIS WITHOUT CONTRAST TECHNIQUE: Multidetector CT imaging of the abdomen and pelvis was performed following the standard protocol without IV contrast. RADIATION DOSE REDUCTION: This exam was performed according to the departmental dose-optimization program which includes automated exposure control, adjustment of the mA and/or kV according to patient size and/or use of iterative reconstruction technique. COMPARISON:  02/28/2015 FINDINGS: Lower chest: Heart is normal size.  Visualized lung bases are clear. Hepatobiliary: Liver, gallbladder and biliary tree are normal. Pancreas: Normal. Spleen: Normal. Adrenals/Urinary Tract: Adrenal glands are normal. Kidneys are normal in size without hydronephrosis or nephrolithiasis. No focal renal mass. No perinephric inflammation or fluid. Ureters and bladder are normal. Stomach/Bowel: Stomach and small bowel are normal. Mild diverticulosis of the colon. Appendix is normal. Vascular/Lymphatic: Abdominal aorta is normal caliber. Remaining vascular  structures are unremarkable. No evidence of adenopathy. Reproductive: Uterus is retroverted as the uterus and bilateral adnexa are otherwise unremarkable. Other: No free fluid or focal inflammatory change. Musculoskeletal: Mild disc space narrowing at the L5-S1 level. Remaining bony structures are unremarkable. IMPRESSION: 1. No acute findings in the abdomen/pelvis. No renal/ureteral stones or obstruction. 2. Mild diverticulosis of the colon. Electronically Signed   By: Toribio Agreste M.D.   On: 06/03/2024 16:34     Procedures   Medications Ordered in the ED - No data to display                                  Medical Decision Making Amount and/or Complexity of Data Reviewed Labs: ordered.   BP 114/70 (BP Location: Right Arm)   Pulse 82   Temp 98.6 F (37 C) (Oral)   Resp 16   SpO2 97%  67:97 PM  33 year old female with history of STI, UTI, anxiety, panic disorder, presenting with complaints of blood in her urine.  Patient states for the past week she has had pain primarily to her left flank and left side of her abdomen.  Furthermore she also noticed blood in her urine.  She states that the symptoms felt similar to kidney stone that she had in the past.  She even went to urgent care center several days ago for her complaint and states that they felt her symptom is related to her kidney stone.  She was prescribed medication for that.  Today she endorsed feeling nauseous as well as well as having abdominal pain prompting this ER visit.  Increasing pain with movement, report subjective fever.  She does not endorse any chest pain or shortness of breath no productive cough no diarrhea constipation.  Exam notable for some tenderness to percussion bilateral CVA as well as some tenderness to her lower abdomen.  Patient overall well-appearing bowel sounds present.  -Labs ordered, independently viewed and interpreted by me.  Labs remarkable for urinalysis showing large hemoglobin on urine dipstick  but no signs of UTI.  Pregnancy test is negative.  Labs overall reassuring. -The patient was maintained on a cardiac monitor.  I personally viewed and interpreted the cardiac monitored which showed an underlying rhythm of: Normal sinus rhythm -Imaging independently viewed and interpreted by me and I agree with radiologist's interpretation.  Result remarkable for abdominal pelvis CT scan without any acute finding no evidence of kidney stone -This patient presents to the ED for concern of flank pain, this involves an extensive number of treatment options, and is a complaint that carries with it a high risk of complications and morbidity.  The differential diagnosis includes kidney stone, UTI, pyelonephritis, dissection, shingles, muscle skeletal strain -Co morbidities that complicate the patient evaluation includes UTI, anxiety, panic attack -Treatment includes reassurance -Reevaluation of the patient after these medicines showed that the patient improved -PCP office notes or outside notes reviewed -Escalation to admission/observation considered: patients feels much better, is comfortable with discharge, and will follow up with urology if she still have persistent hematuria -Prescription medication considered, patient comfortable with ibuprofen  and flexeril -Social Determinant of Health considered which includes tobacco use      Final diagnoses:  Gross hematuria    ED Discharge Orders          Ordered    acetaminophen  (TYLENOL ) 500 MG tablet  Every 6 hours PRN        06/03/24 2048    cyclobenzaprine (FLEXERIL) 10 MG tablet  2 times daily PRN        06/03/24 2048               Nivia Colon, PA-C 06/03/24 2054    Simon Lavonia SAILOR, MD 06/04/24 [phone]

## 2024-06-03 NOTE — Discharge Instructions (Signed)
 You have been evaluated for your symptoms.  Fortunately CT scan today did not show any concerning finding.  You do have blood in your urine this may be due to a recently passed kidney stone however if you still have persistent blood in your urine it is important for you to follow-up closely with urologist for outpatient evaluation and management.  You may take Tylenol  and muscle relaxant as needed for your symptoms.  Please return to the ER if you have any concern.

## 2024-08-26 ENCOUNTER — Ambulatory Visit: Payer: Self-pay | Admitting: *Deleted

## 2024-08-26 NOTE — Telephone Encounter (Signed)
 FYI Only or Action Required?: FYI only for provider: ED advised.  Patient was last seen in primary care on no PCP.  Called Nurse Triage reporting Anxiety.  Symptoms began several years ago.  Interventions attempted: Nothing.  Symptoms are: gradually worsening.  Triage Disposition: Go to ED Now (or PCP Triage)  Patient/caregiver understands and will follow disposition?: Unsure    [facility].              Reason for Disposition  Patient sounds very sick or weak to the triager  Answer Assessment - Initial Assessment Questions No PCP. Recommended ED now to evaluate for sx and increased # of panic attacks over the past several months. Recommended text 988 if needed for assistance.      1. CONCERN: Did anything happen that prompted you to call today?      Life changes past couple of years, and affecting work life 2. ANXIETY SYMPTOMS: Can you describe how you (your loved one; patient) have been feeling? (e.g., tense, restless, panicky, anxious, keyed up, overwhelmed, sense of impending doom).      Not sleeping good, anxious, panic attack at work this week, crying daily  3. ONSET: How long have you been feeling this way? (e.g., hours, days, weeks)     Couple of years  4. SEVERITY: How would you rate the level of anxiety? (e.g., 0 - 10; or mild, moderate, severe).     Severe  5. FUNCTIONAL IMPAIRMENT: How have these feelings affected your ability to do daily activities? Have you had more difficulty than usual doing your normal daily activities? (e.g., getting better, same, worse; self-care, school, work, interactions)     Can do daily living activities but starting to affect work 6. HISTORY: Have you felt this way before? Have you ever been diagnosed with an anxiety problem in the past? (e.g., generalized anxiety disorder, panic attacks, PTSD). If Yes, ask: How was this problem treated? (e.g.,  medicines, counseling, etc.)     Yes did not report taking any medications 7. RISK OF HARM - SUICIDAL IDEATION: Do you ever have thoughts of hurting or killing yourself? If Yes, ask:  Do you have these feelings now? Do you have a plan on how you would do this?     Denies  8. TREATMENT:  What has been done so far to treat this anxiety? (e.g., medicines, relaxation strategies). What has helped?     Not report of treating anxiety 9. THERAPIST: Do you have a counselor or therapist? If Yes, ask: What is their name?     Na  10. POTENTIAL TRIGGERS: Do you drink caffeinated beverages (e.g., coffee, colas, teas), and how much daily? Do you drink alcohol or use any drugs? Have you started any new medicines recently?       Na  11. PATIENT SUPPORT: Who is with you now? Who do you live with? Do you have family or friends who you can talk to?      Not with anyone now but has someone,   Yes father  77. OTHER SYMPTOMS: Do you have any other symptoms? (e.g., feeling depressed, trouble concentrating, trouble sleeping, trouble breathing, palpitations or fast heartbeat, chest pain, sweating, nausea, or diarrhea)       Panic attacks x 1 this week , trouble sleeping , trouble concentrating , crying during call . Chest pain this am takes approx 30 - 1 hour to ease.  Denies chest pain  now. No difficulty breathing no reports of hurting self or others.  13. PREGNANCY: Is there any chance you are pregnant? When was your last menstrual period?       na  Protocols used: Anxiety and Panic Attack-A-AH

## 2024-09-09 ENCOUNTER — Ambulatory Visit: Admitting: General Practice

## 2024-10-05 ENCOUNTER — Ambulatory Visit: Admitting: General Practice
